# Patient Record
Sex: Male | Born: 1945 | Race: Black or African American | Hispanic: No | Marital: Single | Smoking: Never smoker
Health system: Southern US, Community
[De-identification: ages and names within clinical notes are randomized; demographics above are authoritative.]

## PROBLEM LIST (undated history)

## (undated) DIAGNOSIS — C61 Malignant neoplasm of prostate: Secondary | ICD-10-CM

## (undated) DIAGNOSIS — I10 Essential (primary) hypertension: Secondary | ICD-10-CM

## (undated) DIAGNOSIS — S73014A Posterior dislocation of right hip, initial encounter: Secondary | ICD-10-CM

## (undated) HISTORY — PX: OTHER SURGICAL HISTORY: SHX169

## (undated) HISTORY — PX: PROSTATE BIOPSY: SHX241

## (undated) HISTORY — DX: Malignant neoplasm of prostate: C61

---

## 2004-01-30 HISTORY — PX: OTHER SURGICAL HISTORY: SHX169

## 2004-03-10 ENCOUNTER — Emergency Department: Payer: Self-pay | Admitting: General Practice

## 2004-03-14 ENCOUNTER — Ambulatory Visit: Payer: Self-pay | Admitting: Orthopaedic Surgery

## 2012-06-25 ENCOUNTER — Emergency Department (HOSPITAL_COMMUNITY)
Admission: EM | Admit: 2012-06-25 | Discharge: 2012-06-25 | Disposition: A | Discharge status: Home / Self Care | Payer: Medicare Other | Attending: Emergency Medicine | Admitting: Emergency Medicine

## 2012-06-25 ENCOUNTER — Encounter (HOSPITAL_COMMUNITY): Payer: Self-pay | Admitting: *Deleted

## 2012-06-25 ENCOUNTER — Emergency Department (HOSPITAL_COMMUNITY): Payer: Medicare Other

## 2012-06-25 DIAGNOSIS — I10 Essential (primary) hypertension: Secondary | ICD-10-CM | POA: Insufficient documentation

## 2012-06-25 DIAGNOSIS — Z87828 Personal history of other (healed) physical injury and trauma: Secondary | ICD-10-CM | POA: Diagnosis not present

## 2012-06-25 DIAGNOSIS — R509 Fever, unspecified: Secondary | ICD-10-CM | POA: Diagnosis not present

## 2012-06-25 DIAGNOSIS — N433 Hydrocele, unspecified: Secondary | ICD-10-CM | POA: Diagnosis not present

## 2012-06-25 DIAGNOSIS — N452 Orchitis: Secondary | ICD-10-CM | POA: Diagnosis not present

## 2012-06-25 DIAGNOSIS — R109 Unspecified abdominal pain: Secondary | ICD-10-CM | POA: Diagnosis not present

## 2012-06-25 DIAGNOSIS — N5089 Other specified disorders of the male genital organs: Secondary | ICD-10-CM | POA: Insufficient documentation

## 2012-06-25 DIAGNOSIS — N453 Epididymo-orchitis: Secondary | ICD-10-CM | POA: Diagnosis not present

## 2012-06-25 DIAGNOSIS — N451 Epididymitis: Secondary | ICD-10-CM

## 2012-06-25 DIAGNOSIS — R21 Rash and other nonspecific skin eruption: Secondary | ICD-10-CM | POA: Diagnosis not present

## 2012-06-25 HISTORY — DX: Essential (primary) hypertension: I10

## 2012-06-25 LAB — CBC WITH DIFFERENTIAL/PLATELET
Basophils Relative: 0 % (ref 0–1)
HCT: 39.5 % (ref 39.0–52.0)
Hemoglobin: 13.7 g/dL (ref 13.0–17.0)
Lymphs Abs: 1.9 10*3/uL (ref 0.7–4.0)
MCH: 28.4 pg (ref 26.0–34.0)
MCHC: 34.7 g/dL (ref 30.0–36.0)
Monocytes Absolute: 1.6 10*3/uL — ABNORMAL HIGH (ref 0.1–1.0)
Monocytes Relative: 9 % (ref 3–12)
Neutro Abs: 15.1 10*3/uL — ABNORMAL HIGH (ref 1.7–7.7)
Neutrophils Relative %: 81 % — ABNORMAL HIGH (ref 43–77)
RBC: 4.82 MIL/uL (ref 4.22–5.81)

## 2012-06-25 LAB — COMPREHENSIVE METABOLIC PANEL
Alkaline Phosphatase: 43 U/L (ref 39–117)
BUN: 16 mg/dL (ref 6–23)
Chloride: 97 mEq/L (ref 96–112)
Creatinine, Ser: 1.32 mg/dL (ref 0.50–1.35)
GFR calc Af Amer: 64 mL/min — ABNORMAL LOW (ref >90)
GFR calc non Af Amer: 55 mL/min — ABNORMAL LOW (ref >90)
Glucose, Bld: 126 mg/dL — ABNORMAL HIGH (ref 70–99)
Potassium: 3.1 mEq/L — ABNORMAL LOW (ref 3.5–5.1)
Total Bilirubin: 0.9 mg/dL (ref 0.3–1.2)

## 2012-06-25 LAB — URINALYSIS, ROUTINE W REFLEX MICROSCOPIC
Glucose, UA: NEGATIVE mg/dL
Ketones, ur: 15 mg/dL — AB
Nitrite: NEGATIVE
Urobilinogen, UA: 1 mg/dL (ref 0.0–1.0)

## 2012-06-25 LAB — URINE MICROSCOPIC-ADD ON

## 2012-06-25 MED ORDER — SULFAMETHOXAZOLE-TRIMETHOPRIM 800-160 MG PO TABS: 1.0000 | ORAL_TABLET | Freq: Two times a day (BID) | ORAL | Status: AC

## 2012-06-25 MED ORDER — OXYCODONE-ACETAMINOPHEN 5-325 MG PO TABS
1.0000 | ORAL_TABLET | Freq: Once | ORAL | Status: AC
  Administered 2012-06-25: 1 via ORAL
  Filled 2012-06-25: qty 1

## 2012-06-25 MED ORDER — OXYCODONE-ACETAMINOPHEN 5-325 MG PO TABS: 1.0000 | ORAL_TABLET | ORAL | Status: DC | PRN

## 2012-06-25 MED ORDER — SULFAMETHOXAZOLE-TMP DS 800-160 MG PO TABS
1.0000 | ORAL_TABLET | Freq: Once | ORAL | Status: AC
  Administered 2012-06-25: 1 via ORAL
  Filled 2012-06-25: qty 1

## 2012-06-25 NOTE — ED Notes (Signed)
Pain to lower abdomen with pain and swelling to right testicle.
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
## 2012-06-27 LAB — URINE CULTURE: Colony Count: 15000

## 2012-07-15 DIAGNOSIS — N453 Epididymo-orchitis: Secondary | ICD-10-CM | POA: Diagnosis not present

## 2012-08-12 ENCOUNTER — Other Ambulatory Visit (HOSPITAL_COMMUNITY): Payer: Self-pay | Admitting: Urology

## 2012-08-12 DIAGNOSIS — N451 Epididymitis: Secondary | ICD-10-CM

## 2012-08-12 DIAGNOSIS — N453 Epididymo-orchitis: Secondary | ICD-10-CM | POA: Diagnosis not present

## 2012-08-15 ENCOUNTER — Ambulatory Visit (HOSPITAL_COMMUNITY)
Admission: RE | Admit: 2012-08-15 | Discharge: 2012-08-15 | Disposition: A | Discharge status: Home / Self Care | Payer: Medicare Other | Source: Ambulatory Visit | Attending: Urology | Admitting: Urology

## 2012-08-15 DIAGNOSIS — N451 Epididymitis: Secondary | ICD-10-CM

## 2012-10-21 DIAGNOSIS — H409 Unspecified glaucoma: Secondary | ICD-10-CM | POA: Diagnosis not present

## 2012-10-21 DIAGNOSIS — H4011X0 Primary open-angle glaucoma, stage unspecified: Secondary | ICD-10-CM | POA: Diagnosis not present

## 2013-02-20 DIAGNOSIS — I10 Essential (primary) hypertension: Secondary | ICD-10-CM | POA: Diagnosis not present

## 2013-05-22 DIAGNOSIS — E7800 Pure hypercholesterolemia, unspecified: Secondary | ICD-10-CM | POA: Diagnosis not present

## 2013-06-09 ENCOUNTER — Telehealth: Payer: Self-pay

## 2013-06-09 ENCOUNTER — Other Ambulatory Visit: Payer: Self-pay

## 2013-06-09 DIAGNOSIS — Z1211 Encounter for screening for malignant neoplasm of colon: Secondary | ICD-10-CM

## 2013-06-09 NOTE — Telephone Encounter (Signed)
I returned pt's call and LMOM for a return call.

## 2013-06-09 NOTE — Telephone Encounter (Signed)
Wanting to set up a TCS because of his age.

## 2013-06-11 DIAGNOSIS — E7800 Pure hypercholesterolemia, unspecified: Secondary | ICD-10-CM | POA: Diagnosis not present

## 2013-06-11 DIAGNOSIS — R7301 Impaired fasting glucose: Secondary | ICD-10-CM | POA: Diagnosis not present

## 2013-06-15 NOTE — Telephone Encounter (Signed)
Gastroenterology Pre-Procedure Review  Request Date: 06/09/2013 Requesting Physician: Dr. Legrand Rams  PATIENT REVIEW QUESTIONS: The patient responded to the following health history questions as indicated:    1. Diabetes Melitis: no 2. Joint replacements in the past 12 months: no 3. Major health problems in the past 3 months: no 4. Has an artificial valve or MVP: no 5. Has a defibrillator: no 6. Has been advised in past to take antibiotics in advance of a procedure like teeth cleaning: no    MEDICATIONS & ALLERGIES:    Patient reports the following regarding taking any blood thinners:   Plavix? no Aspirin? no Coumadin? no  Patient confirms/reports the following medications:  Current Outpatient Prescriptions  Medication Sig Dispense Refill  . ibuprofen (ADVIL,MOTRIN) 200 MG tablet Take 600 mg by mouth every 6 (six) hours as needed for pain.      . Multiple Vitamins-Minerals (MULTIVITAMIN GUMMIES ADULT) CHEW Chew 1 each by mouth daily.      Marland Kitchen olmesartan-hydrochlorothiazide (BENICAR HCT) 40-25 MG per tablet Take 1 tablet by mouth every morning.      Marland Kitchen oxyCODONE-acetaminophen (PERCOCET/ROXICET) 5-325 MG per tablet Take 1 tablet by mouth every 4 (four) hours as needed for pain.  20 tablet  0   No current facility-administered medications for this visit.    Patient confirms/reports the following allergies:  No Known Allergies  No orders of the defined types were placed in this encounter.    AUTHORIZATION INFORMATION Primary Insurance:   ID #:  Group #:  Pre-Cert / Auth required: Pre-Cert / Auth #:   Secondary Insurance:   ID #:   Group #:  Pre-Cert / Auth required:  Pre-Cert / Auth #:   SCHEDULE INFORMATION: Procedure has been scheduled as follows:  Date: 07/02/2013                   Time:  12:45 PM Location: [facility] Short Stay   This Gastroenterology Pre-Precedure Review Form is being routed to the following provider(s): R. Garfield Cornea, MD

## 2013-06-16 ENCOUNTER — Encounter (HOSPITAL_COMMUNITY): Payer: Self-pay | Admitting: Pharmacy Technician

## 2013-06-17 MED ORDER — PEG-KCL-NACL-NASULF-NA ASC-C 100 G PO SOLR: 1.0000 | ORAL | Status: DC

## 2013-06-17 NOTE — Telephone Encounter (Signed)
Rx sent to the pharmacy and instructions mailed to pt.  

## 2013-06-17 NOTE — Telephone Encounter (Signed)
Appropriate.

## 2013-06-18 MED ORDER — PEG 3350-KCL-NA BICARB-NACL 420 G PO SOLR: 4000.0000 mL | ORAL | Status: DC

## 2013-06-18 NOTE — Telephone Encounter (Signed)
Movie prep will cost pt $99.00. Pt wants cheaper prep. Will send in [place].

## 2013-06-18 NOTE — Telephone Encounter (Signed)
The trilyte prep will just have a $2.00 copay. I called and LMOM for the pt and I am mailing him the instructions to the Trilyte prep.

## 2013-06-18 NOTE — Addendum Note (Signed)
Addended by: Everardo All on: 06/18/2013 10:28 AM   Modules accepted: Orders

## 2013-07-02 ENCOUNTER — Ambulatory Visit (HOSPITAL_COMMUNITY)
Admission: RE | Admit: 2013-07-02 | Discharge: 2013-07-02 | Disposition: A | Discharge status: Home / Self Care | Payer: Medicare Other | Source: Ambulatory Visit | Attending: Internal Medicine | Admitting: Internal Medicine

## 2013-07-02 ENCOUNTER — Encounter (HOSPITAL_COMMUNITY)
Admission: RE | Disposition: A | Discharge status: Home / Self Care | Payer: Self-pay | Source: Ambulatory Visit | Attending: Internal Medicine

## 2013-07-02 ENCOUNTER — Encounter (HOSPITAL_COMMUNITY): Payer: Self-pay | Admitting: *Deleted

## 2013-07-02 DIAGNOSIS — I10 Essential (primary) hypertension: Secondary | ICD-10-CM | POA: Insufficient documentation

## 2013-07-02 DIAGNOSIS — Z79899 Other long term (current) drug therapy: Secondary | ICD-10-CM | POA: Diagnosis not present

## 2013-07-02 DIAGNOSIS — Z1212 Encounter for screening for malignant neoplasm of rectum: Secondary | ICD-10-CM

## 2013-07-02 DIAGNOSIS — Z1211 Encounter for screening for malignant neoplasm of colon: Secondary | ICD-10-CM | POA: Insufficient documentation

## 2013-07-02 HISTORY — PX: COLONOSCOPY: SHX5424

## 2013-07-02 SURGERY — COLONOSCOPY
Anesthesia: Moderate Sedation

## 2013-07-02 MED ORDER — MIDAZOLAM HCL 5 MG/5ML IJ SOLN
INTRAMUSCULAR | Status: AC
  Filled 2013-07-02: qty 10

## 2013-07-02 MED ORDER — ONDANSETRON HCL 4 MG/2ML IJ SOLN
INTRAMUSCULAR | Status: AC
  Filled 2013-07-02: qty 2

## 2013-07-02 MED ORDER — ONDANSETRON HCL 4 MG/2ML IJ SOLN
INTRAMUSCULAR | Status: DC | PRN
  Administered 2013-07-02: 4 mg via INTRAVENOUS

## 2013-07-02 MED ORDER — SODIUM CHLORIDE 0.9 % IV SOLN
INTRAVENOUS | Status: DC
  Administered 2013-07-02: 11:00:00 via INTRAVENOUS

## 2013-07-02 MED ORDER — MEPERIDINE HCL 100 MG/ML IJ SOLN
INTRAMUSCULAR | Status: AC
  Filled 2013-07-02: qty 2

## 2013-07-02 MED ORDER — MEPERIDINE HCL 100 MG/ML IJ SOLN
INTRAMUSCULAR | Status: DC | PRN
  Administered 2013-07-02: 25 mg via INTRAVENOUS
  Administered 2013-07-02: 50 mg via INTRAVENOUS

## 2013-07-02 MED ORDER — STERILE WATER FOR IRRIGATION IR SOLN
Status: DC | PRN
  Administered 2013-07-02: 11:00:00

## 2013-07-02 MED ORDER — MIDAZOLAM HCL 5 MG/5ML IJ SOLN
INTRAMUSCULAR | Status: DC | PRN
  Administered 2013-07-02: 1 mg via INTRAVENOUS
  Administered 2013-07-02 (×2): 2 mg via INTRAVENOUS

## 2013-07-02 NOTE — Op Note (Signed)
[facility] [address] [place], [address]   COLONOSCOPY PROCEDURE REPORT  PATIENT: Darrell Harrison, Darrell Harrison  MR#:         [id number] BIRTHDATE: [birth year] , 66  yrs. old GENDER: Male ENDOSCOPIST: R.  Garfield Cornea, MD FACP FACG REFERRED BY:  Conni Slipper, M.D. PROCEDURE DATE:  07/02/2013 PROCEDURE:    Ileocolonoscopy-screening  INDICATIONS: First ever average risk colorectal cancer screening examination  INFORMED CONSENT:  The risks, benefits, alternatives and imponderables including but not limited to bleeding, perforation as well as the possibility of a missed lesion have been reviewed.  The potential for biopsy, lesion removal, etc. have also been discussed.  Questions have been answered.  All parties agreeable. Please see the history and physical in the medical record for more information.  MEDICATIONS: Versed 5 mg IV and Demerol 75 mg IV in divided doses. Zofran 4 mg IV.  DESCRIPTION OF PROCEDURE:  After a digital rectal exam was performed, the EC-3890Li ([id number])  colonoscope was advanced from the anus through the rectum and colon to the area of the cecum, ileocecal valve and appendiceal orifice.  The cecum was deeply intubated.  These structures were well-seen and photographed for the record.  From the level of the cecum and ileocecal valve, the scope was slowly and cautiously withdrawn.  The mucosal surfaces were carefully surveyed utilizing scope tip deflection to facilitate fold flattening as needed.  The scope was pulled down into the rectum where a thorough examination including retroflexion was performed.    FINDINGS:  Adequate preparation. Normal rectum. Normal appearing colonic mucosa. Normal-appearing distal 5 cm of terminal ileal mucosa.  THERAPEUTIC / DIAGNOSTIC MANEUVERS PERFORMED:  None  COMPLICATIONS: None  CECAL WITHDRAWAL TIME:  8 minutes  IMPRESSION:  Normal ileocolonoscopy  RECOMMENDATIONS: Repeat colonoscopy in 10 years for  screening purposes.   _______________________________ eSigned:  R. Garfield Cornea, MD FACP [facility] 07/02/2013 11:55 AM   CC:

## 2013-07-02 NOTE — Discharge Instructions (Addendum)
°  Colonoscopy Discharge Instructions  Read the instructions outlined below and refer to this sheet in the next few weeks. These discharge instructions provide you with general information on caring for yourself after you leave the hospital. Your doctor may also give you specific instructions. While your treatment has been planned according to the most current medical practices available, unavoidable complications occasionally occur. If you have any problems or questions after discharge, call Dr. Gala Romney at [phone]. ACTIVITY  You may resume your regular activity, but move at a slower pace for the next 24 hours.   Take frequent rest periods for the next 24 hours.   Walking will help get rid of the air and reduce the bloated feeling in your belly (abdomen).   No driving for 24 hours (because of the medicine (anesthesia) used during the test).    Do not sign any important legal documents or operate any machinery for 24 hours (because of the anesthesia used during the test).  NUTRITION  Drink plenty of fluids.   You may resume your normal diet as instructed by your doctor.   Begin with a light meal and progress to your normal diet. Heavy or fried foods are harder to digest and may make you feel sick to your stomach (nauseated).   Avoid alcoholic beverages for 24 hours or as instructed.  MEDICATIONS  You may resume your normal medications unless your doctor tells you otherwise.  WHAT YOU CAN EXPECT TODAY  Some feelings of bloating in the abdomen.   Passage of more gas than usual.   Spotting of blood in your stool or on the toilet paper.  IF YOU HAD POLYPS REMOVED DURING THE COLONOSCOPY:  No aspirin products for 7 days or as instructed.   No alcohol for 7 days or as instructed.   Eat a soft diet for the next 24 hours.  FINDING OUT THE RESULTS OF YOUR TEST Not all test results are available during your visit. If your test results are not back during the visit, make an appointment  with your caregiver to find out the results. Do not assume everything is normal if you have not heard from your caregiver or the medical facility. It is important for you to follow up on all of your test results.  SEEK IMMEDIATE MEDICAL ATTENTION IF:  You have more than a spotting of blood in your stool.   Your belly is swollen (abdominal distention).   You are nauseated or vomiting.   You have a temperature over 101.   You have abdominal pain or discomfort that is severe or gets worse throughout the day.     Your colonoscopy today was normal  I recommend you have another colonoscopy for screening purposes in 10 years
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
## 2013-07-03 ENCOUNTER — Encounter (HOSPITAL_COMMUNITY): Payer: Self-pay | Admitting: Internal Medicine

## 2013-08-21 DIAGNOSIS — E7800 Pure hypercholesterolemia, unspecified: Secondary | ICD-10-CM | POA: Diagnosis not present

## 2013-08-21 DIAGNOSIS — I10 Essential (primary) hypertension: Secondary | ICD-10-CM | POA: Diagnosis not present

## 2014-03-02 DIAGNOSIS — E785 Hyperlipidemia, unspecified: Secondary | ICD-10-CM | POA: Diagnosis not present

## 2014-03-02 DIAGNOSIS — Z0000 Encounter for general adult medical examination without abnormal findings: Secondary | ICD-10-CM | POA: Diagnosis not present

## 2014-03-02 DIAGNOSIS — Z0001 Encounter for general adult medical examination with abnormal findings: Secondary | ICD-10-CM | POA: Diagnosis not present

## 2014-03-02 DIAGNOSIS — R739 Hyperglycemia, unspecified: Secondary | ICD-10-CM | POA: Diagnosis not present

## 2014-03-02 DIAGNOSIS — C61 Malignant neoplasm of prostate: Secondary | ICD-10-CM | POA: Diagnosis not present

## 2014-03-02 DIAGNOSIS — I10 Essential (primary) hypertension: Secondary | ICD-10-CM | POA: Diagnosis not present

## 2014-03-02 DIAGNOSIS — E663 Overweight: Secondary | ICD-10-CM | POA: Diagnosis not present

## 2014-03-05 DIAGNOSIS — E785 Hyperlipidemia, unspecified: Secondary | ICD-10-CM | POA: Diagnosis not present

## 2014-03-05 DIAGNOSIS — E663 Overweight: Secondary | ICD-10-CM | POA: Diagnosis not present

## 2014-03-05 DIAGNOSIS — R972 Elevated prostate specific antigen [PSA]: Secondary | ICD-10-CM | POA: Diagnosis not present

## 2014-03-05 DIAGNOSIS — I10 Essential (primary) hypertension: Secondary | ICD-10-CM | POA: Diagnosis not present

## 2014-04-06 DIAGNOSIS — R972 Elevated prostate specific antigen [PSA]: Secondary | ICD-10-CM | POA: Diagnosis not present

## 2014-04-06 DIAGNOSIS — N401 Enlarged prostate with lower urinary tract symptoms: Secondary | ICD-10-CM | POA: Diagnosis not present

## 2014-04-06 DIAGNOSIS — R351 Nocturia: Secondary | ICD-10-CM | POA: Diagnosis not present

## 2014-05-17 DIAGNOSIS — C61 Malignant neoplasm of prostate: Secondary | ICD-10-CM | POA: Diagnosis not present

## 2014-05-17 DIAGNOSIS — R972 Elevated prostate specific antigen [PSA]: Secondary | ICD-10-CM | POA: Diagnosis not present

## 2014-05-24 ENCOUNTER — Other Ambulatory Visit (HOSPITAL_COMMUNITY): Payer: Self-pay | Admitting: Urology

## 2014-05-24 DIAGNOSIS — C61 Malignant neoplasm of prostate: Secondary | ICD-10-CM

## 2014-05-31 ENCOUNTER — Other Ambulatory Visit (HOSPITAL_COMMUNITY): Payer: Self-pay | Admitting: Urology

## 2014-05-31 DIAGNOSIS — C61 Malignant neoplasm of prostate: Secondary | ICD-10-CM

## 2014-06-01 DIAGNOSIS — N182 Chronic kidney disease, stage 2 (mild): Secondary | ICD-10-CM | POA: Diagnosis not present

## 2014-06-01 DIAGNOSIS — E785 Hyperlipidemia, unspecified: Secondary | ICD-10-CM | POA: Diagnosis not present

## 2014-06-01 DIAGNOSIS — I10 Essential (primary) hypertension: Secondary | ICD-10-CM | POA: Diagnosis not present

## 2014-06-01 DIAGNOSIS — E663 Overweight: Secondary | ICD-10-CM | POA: Diagnosis not present

## 2014-06-10 ENCOUNTER — Ambulatory Visit (HOSPITAL_COMMUNITY): Payer: Medicare Other

## 2014-06-10 ENCOUNTER — Encounter (HOSPITAL_COMMUNITY): Payer: Medicare Other

## 2014-06-18 ENCOUNTER — Encounter (HOSPITAL_COMMUNITY)
Admission: RE | Admit: 2014-06-18 | Discharge: 2014-06-18 | Disposition: A | Discharge status: Home / Self Care | Payer: Medicare Other | Source: Ambulatory Visit | Attending: Urology | Admitting: Urology

## 2014-06-18 DIAGNOSIS — C61 Malignant neoplasm of prostate: Secondary | ICD-10-CM | POA: Insufficient documentation

## 2014-06-18 MED ORDER — TECHNETIUM TC 99M MEDRONATE IV KIT
25.0000 | PACK | Freq: Once | INTRAVENOUS | Status: AC | PRN
  Administered 2014-06-18: 25 via INTRAVENOUS

## 2014-07-02 ENCOUNTER — Ambulatory Visit (HOSPITAL_COMMUNITY)
Admission: RE | Admit: 2014-07-02 | Discharge: 2014-07-02 | Disposition: A | Discharge status: Home / Self Care | Payer: Medicare Other | Source: Ambulatory Visit | Attending: Urology | Admitting: Urology

## 2014-07-02 DIAGNOSIS — C61 Malignant neoplasm of prostate: Secondary | ICD-10-CM

## 2014-07-15 ENCOUNTER — Ambulatory Visit (HOSPITAL_COMMUNITY): Payer: Medicare Other

## 2014-08-13 ENCOUNTER — Ambulatory Visit (HOSPITAL_COMMUNITY)
Admission: RE | Admit: 2014-08-13 | Discharge: 2014-08-13 | Disposition: A | Discharge status: Home / Self Care | Payer: Medicare Other | Source: Ambulatory Visit | Attending: Urology | Admitting: Urology

## 2014-08-13 DIAGNOSIS — N402 Nodular prostate without lower urinary tract symptoms: Secondary | ICD-10-CM | POA: Diagnosis not present

## 2014-08-13 DIAGNOSIS — R972 Elevated prostate specific antigen [PSA]: Secondary | ICD-10-CM | POA: Diagnosis present

## 2014-08-13 DIAGNOSIS — N400 Enlarged prostate without lower urinary tract symptoms: Secondary | ICD-10-CM | POA: Diagnosis not present

## 2014-08-13 LAB — POCT I-STAT CREATININE: Creatinine, Ser: 1.5 mg/dL — ABNORMAL HIGH (ref 0.61–1.24)

## 2014-08-13 MED ORDER — GADOBENATE DIMEGLUMINE 529 MG/ML IV SOLN
20.0000 mL | Freq: Once | INTRAVENOUS | Status: AC | PRN
Start: 2014-08-13 — End: 2014-08-13
  Administered 2014-08-13: 17 mL via INTRAVENOUS

## 2014-08-17 ENCOUNTER — Telehealth: Payer: Self-pay | Admitting: Medical Oncology

## 2014-08-17 NOTE — Telephone Encounter (Signed)
Oncology Nurse Navigator Documentation  Oncology Nurse Navigator Flowsheets 08/17/2014  Referral date to RadOnc/MedOnc 08/17/2014  Navigator Encounter Type Introductory phone call-I called pt to introduce myself as the Prostate Nurse Navigator and the Coordinator of the Prostate [place].  1. I confirmed with the patient he is aware of his referral to the clinic. He states that his daughter provides transportation for him and she will be on vacation next week so he will not be able to attend the 7/26 clinic. I explained we can reschedule him to one of the clinics in August. He asked that I speak with his daughter with new date and time.   2. I discussed the format of the clinic and the physicians he will be seeing that day.  3. I discussed where the clinic is located and how to contact me.  4. I confirmed his address and informed him I would be mailing a packet of information and forms to be completed. I asked him to bring them with him the day of his appointment.   He voiced understanding of the above. I asked him to call me if he has any questions or concerns regarding his appointments or the forms he needs to complete.  Time Spent with Patient 15

## 2014-08-24 ENCOUNTER — Ambulatory Visit: Payer: Medicare Other | Admitting: Radiation Oncology

## 2014-08-31 DIAGNOSIS — E785 Hyperlipidemia, unspecified: Secondary | ICD-10-CM | POA: Diagnosis not present

## 2014-08-31 DIAGNOSIS — I10 Essential (primary) hypertension: Secondary | ICD-10-CM | POA: Diagnosis not present

## 2014-08-31 DIAGNOSIS — E663 Overweight: Secondary | ICD-10-CM | POA: Diagnosis not present

## 2014-09-07 ENCOUNTER — Encounter: Payer: Self-pay | Admitting: Radiation Oncology

## 2014-09-07 NOTE — Progress Notes (Signed)
GU Location of Tumor / Histology: prostatic adenocarcinoma   If Prostate Cancer, Gleason Score is (3 + 3) and PSA is (23.75)  Darrell Harrison . was referred by his PCP, Dr. Legrand Rams, to Dr. Junious Silk for elevated PSA of 18.8 on February 2016.  Biopsies of prostate (if applicable) revealed:    Past/Anticipated interventions by urology, if any: ultrasound, biopsy and referral to [facility]  Past/Anticipated interventions by medical oncology, if any: referral to [facility]  Weight changes, if any: no  Bowel/Bladder complaints, if any: occasional weak stream, nocturia x 3 and ED   Nausea/Vomiting, if any: no  Pain issues, if any:  no  SAFETY ISSUES:  Prior radiation? no  Pacemaker/ICD? no  Possible current pregnancy? no  Is the patient on methotrexate? no  Current Complaints / other details:  69 year old male. Single. Four children. Prostate volume 47.6 cc.

## 2014-09-09 ENCOUNTER — Telehealth: Payer: Self-pay | Admitting: Medical Oncology

## 2014-09-09 NOTE — Telephone Encounter (Signed)
Oncology Nurse Navigator Documentation  Oncology Nurse Navigator Flowsheets 08/17/2014 09/09/2014  Referral date to RadOnc/MedOnc 08/17/2014 -Left a message requesting a return call to confirm his appointment 8/12 arriving at 7:30am.  Navigator Encounter Type Introductory phone call Telephone  Time Spent with Patient 15 15

## 2014-09-10 ENCOUNTER — Encounter: Payer: Self-pay | Admitting: Medical Oncology

## 2014-09-10 ENCOUNTER — Ambulatory Visit: Payer: Medicare Other

## 2014-09-10 ENCOUNTER — Ambulatory Visit
Admission: RE | Admit: 2014-09-10 | Discharge: 2014-09-10 | Disposition: A | Discharge status: Home / Self Care | Payer: Medicare Other | Source: Ambulatory Visit | Attending: Radiation Oncology | Admitting: Radiation Oncology

## 2014-09-10 ENCOUNTER — Encounter: Payer: Self-pay | Admitting: General Practice

## 2014-09-10 ENCOUNTER — Encounter: Payer: Self-pay | Admitting: Radiation Oncology

## 2014-09-10 ENCOUNTER — Ambulatory Visit (HOSPITAL_BASED_OUTPATIENT_CLINIC_OR_DEPARTMENT_OTHER): Payer: Medicare Other | Admitting: Oncology

## 2014-09-10 VITALS — BP 188/105 | HR 66 | Resp 16 | Ht 69.0 in | Wt 197.0 lb

## 2014-09-10 DIAGNOSIS — C61 Malignant neoplasm of prostate: Secondary | ICD-10-CM | POA: Insufficient documentation

## 2014-09-10 NOTE — Progress Notes (Signed)
                               Care Plan Summary  Name: Darrell Harrison, Study DOB: 01/06/1946   Your Medical Team:   Urologist -  Dr. Raynelle Bring, [facility]  Radiation Oncologist - Dr. Tyler Pita, [facility]   Medical Oncologist - Dr. Zola Button, [facility]  Recommendations: 1) Recheck PSA 2) Surgery 3) Androgen Deprivation with Radiation  * These recommendations are based on information available as of today's consult.      Recommendations may change depending on the results of further tests or exams.    Next Steps: 1) PSA drawn today 2) Call you with results  3) Scheduled for gold markers Dr. Junious Silk 4) Schedule radiation in [place] ( Dr. Tammi Klippel will schedule)  When appointments need to be scheduled, you will be contacted by [facility] and/or [facility].  Questions?  Please do not hesitate to call Cira Rue, RN, BSN, CRNI at [phone] any questions or concerns.  Shirlean Mylar is your Oncology Nurse Navigator and is available to assist you while you're receiving your medical care at [facility].
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
## 2014-09-10 NOTE — Consult Note (Signed)
Chief Complaint  Prostate Cancer   Reason For Visit  Reason for consult: To discuss treatment options for prostate cancer and specifically to consider surgical treatment with robotic prostatectomy. Physician requesting consult: Dr. Eda Keys PCP: Dr. Legrand Rams Location of consult: [facility]   History of Present Illness   Darrell Harrison is a 69 year old healthy gentleman who was noted to have an elevated PSA of 18.8 prompting urologic referral.  His PSA was repeated and found to be 23.75 despite a normal DRE. He underwent a prostate needle biopsy on 05/17/14 demonstrating Gleason 3+3=6 adenocarcinoma of the prostate in 5% of 1 out of 12 biopsy cores.  Prostate volume was 47.6 cc.  He underwent staging studies including a bone scan (06/18/14) which was unremarkable and a CT scan of the pelvis (06/18/14) which was also unremarkable without evidence of metastatic disease. An MRI of the prostate was then performed on 08/13/14 that demonstrated a 17 mm hypointense area of the prostatic apex with restricted diffusion in the central gland mostly on the right that is not specific for malignancy.  He has no family history of prostate cancer.  He is seen with his daughter today.  TNM stage: cT1c Nx Mx PSA: 23.75 Gleason score: 3+3=6 Biopsy (05/17/14): 1/12 cores -- L apex (5%) Prostate volume: 47.6 cc PSAD: 0.50  Nomogram OC disease: 41% EPE: 55% SVI: 2% LNI: 2% PFS (surgery): 83% at 5 years, 72% at 10 years  Urinary function:  IPSS is 7.  Erectile function: SHIM score is 15.  He estimates that he could get an adequate erection maybe 3 out of 10 times.  Erectile function is not an important priority for him.   Past Medical History  1. History of arthritis (Z87.39)  2. History of hypercholesterolemia (Z86.39)  3. History of hypertension (Z86.79)  Surgical History  1. History of No Surgical Problems  Current Meds  1. Aspirin 81 MG TABS;   Therapy: (Recorded:08Mar2016) to Recorded  2. Atorvastatin Calcium TABS;  Therapy: (Recorded:08Mar2016) to Recorded  3. Losartan Potassium-HCTZ 100-25 MG Oral Tablet;  Therapy: (Recorded:08Mar2016) to Recorded  Allergies  1. No Known Drug Allergies  Family History  1. Family history of myocardial infarction (Z82.49) : Father  2. Family history of stroke (Z82.3) : Mother  Social History   Four children   Never a smoker   Occasional alcohol use   Single  Review of Systems AU Complete-Male: Genitourinary, constitutional, skin, eye, otolaryngeal, hematologic/lymphatic, cardiovascular, pulmonary, endocrine, musculoskeletal, gastrointestinal, neurological and psychiatric system(s) were reviewed and pertinent findings if present are noted and are otherwise negative.    Physical Exam Constitutional: Well nourished and well developed . No acute distress.  ENT:. The ears and nose are normal in appearance.  Neck: The appearance of the neck is normal and no neck mass is present.  Pulmonary: No respiratory distress, normal respiratory rhythm and effort and clear bilateral breath sounds.  Cardiovascular: Heart rate and rhythm are normal . No peripheral edema.  Abdomen: The abdomen is rounded. The abdomen is soft and nontender. No masses are palpated. No CVA tenderness. No hernias are palpable. No hepatosplenomegaly noted.  Rectal: Rectal exam demonstrates normal sphincter tone, no tenderness and no masses. Prostate size is estimated to be 55 g. The prostate has no nodularity and is not tender. The left seminal vesicle is nonpalpable. The right seminal vesicle is nonpalpable. The perineum is normal on inspection.  Lymphatics: The femoral and inguinal nodes are  not enlarged or tender.  Skin: Normal skin turgor, no visible rash and no visible skin lesions.  Neuro/Psych:. Mood and affect are appropriate.    Results/Data  I have independently reviewed his medical records, PSA results, imaging  studies, and pathology slides as described above.     Assessment  1. Prostate cancer (C61)  Discussion/Summary  1. Prostate cancer: I had a long discussion with Mr. Koval and his daughter today. We specifically discussed the discordance between his PSA values and his pathology findings.  He technically has high risk prostate cancer based on his PSA and there is no other explanation for his PSA elevation.  He understands that standard recommendations would include primary surgical therapy or radiation therapy/long term androgen deprivation for 2-3 years. We reviewed the pros and cons of each of those options today.  I also recommended that he proceed with a repeat PSA today to confirm the high risk nature of his disease. He understands that if his PSA has significantly decreased that he may be reclassified with low risk disease which would change his treatment options.   The patient was counseled about the natural history of prostate cancer and the standard treatment options that are available for prostate cancer. It was explained to him how his age and life expectancy, clinical stage, Gleason score, and PSA affect his prognosis, the decision to proceed with additional staging studies, as well as how that information influences recommended treatment strategies. We discussed the roles for active surveillance, radiation therapy, surgical therapy, androgen deprivation, as well as ablative therapy options for the treatment of prostate cancer as appropriate to his individual cancer situation. We discussed the risks and benefits of these options with regard to their impact on cancer control and also in terms of potential adverse events, complications, and impact on quiality of life particularly related to urinary, bowel, and sexual function. The patient was encouraged to ask questions throughout the discussion today and all questions were answered to his stated satisfaction. In addition, the patient was provided  with and/or directed to appropriate resources and literature for further education about prostate cancer and treatment options.   We discussed surgical therapy for prostate cancer including the different available surgical approaches. We discussed, in detail, the risks and expectations of surgery with regard to cancer control, urinary control, and erectile function as well as the expected postoperative recovery process. Additional risks of surgery including but not limited to bleeding, infection, hernia formation, nerve damage, lymphocele formation, bowel/rectal injury potentially necessitating colostomy, damage to the urinary tract resulting in urine leakage, urethral stricture, and the cardiopulmonary risks such as myocardial infarction, stroke, death, venothromboembolism, etc. were explained. The risk of open surgical conversion for robotic/laparoscopic prostatectomy was also discussed.   He will meet with Dr. Alen Blew and Dr. Tammi Klippel today and will consider his options.  If he proceeded with surgical treatment, I have recommended a BNS RAL radical prostatectomy with BPLND.  A total of 55 minutes were spent in the overall care of the patient today with 45 minutes in direct face to face consultation.   cc: Dr. Tyler Pita Dr. Zola Button Dr. Eda Keys Dr. Hale Drone Electronically signed by : Raynelle Bring, M.D.; Sep 10 2014 12:19PM EST

## 2014-09-10 NOTE — Progress Notes (Signed)
[facility] Psychosocial Distress Screening Spiritual Care  Met with Darrell Harrison and his daughter at [facility] to introduce [facility] team/resources, reviewing distress screen per protocol.  The patient scored a 7 on the Psychosocial Distress Thermometer which indicates severe distress. Also assessed for distress and other psychosocial needs.   ONCBCN DISTRESS SCREENING 09/10/2014  Screening Type Initial Screening  Distress experienced in past week (1-10) 7  Practical problem type Insurance  Emotional problem type Nervousness/Anxiety;Adjusting to illness  Information Concerns Type Lack of info about diagnosis  Referral to support programs Yes  Other Spiritual Care   Per pt, he is still nervous about his health situation, but less distressed now that he has more info from clinic; he is very appreciative of in-person team approach and clear communication.  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    Follow up needed: Yes.  Pt may benefit from a f/u call from LCSW to field questions/offer support.  Pt/dtr aware of ongoing chaplain/support team availability and have print materials with contact info.  Please also page as needs arise.  Thank you.  [place], [place] Pager [phone] Voicemail  [phone]

## 2014-09-10 NOTE — Progress Notes (Signed)
Divorced. Works as an Metallurgist. One daughter, Seaborn Nakama who resides in [place].

## 2014-09-10 NOTE — Progress Notes (Signed)
Please see consult note.  
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
## 2014-09-11 DIAGNOSIS — C61 Malignant neoplasm of prostate: Secondary | ICD-10-CM | POA: Insufficient documentation

## 2014-09-13 ENCOUNTER — Telehealth: Payer: Self-pay | Admitting: Medical Oncology

## 2014-09-13 LAB — PSA: PSA: 27.72 ng/mL — ABNORMAL HIGH (ref <=4.00)

## 2014-09-13 NOTE — Telephone Encounter (Signed)
Oncology Nurse Navigator Documentation  Oncology Nurse Navigator Flowsheets 09/09/2014 09/10/2014 09/13/2014  Referral date to RadOnc/MedOnc - - -  Navigator Encounter Type Telephone Clinic/MDC Telephone- Patient and his daughter called asking if the PSA results are back. I gave them the results. Pt knows that he will now need radiation. He asked if there was a facility in [place] where  he could get the radiation treatments. He works in [employer] and this would allow him to work. I informed them he can get his radiation at [facility]. I forwarded a message to Dr. Tammi Klippel and Dr. Alinda Money with the above information.  Barriers/Navigation Needs - - No barriers at this time  Interventions - - Coordination of Care  Coordination of Care - - Other  Support Groups/Services - Friends and Family Friends and Family  Time Spent with Patient 10 17 51

## 2014-09-14 ENCOUNTER — Telehealth: Payer: Self-pay | Admitting: Medical Oncology

## 2014-09-14 NOTE — Telephone Encounter (Signed)
Oncology Nurse Navigator Documentation  Oncology Nurse Navigator Flowsheets 09/10/2014 09/13/2014 09/14/2014  Referral date to RadOnc/MedOnc - - -  Navigator Encounter Type Clinic/MDC Telephone Telephone- Darrell Harrison, daughter called stating her father received a  call from [facility] with an appointment 8/18. She asked if this will be the appointment where he has the markers placed. I explained this is a consult with Dr. Donella Stade to discuss radiation and treatment planning. Dr. Junious Silk will place the gold markers. She voiced understanding and thanked me for clarification.  Barriers/Navigation Needs - No barriers at this time -  Interventions - Coordination of Care -  Coordination of Care - Other Other  Support Groups/Services Friends and Family Friends and Family -  Time Spent with Patient [phone]

## 2014-09-16 ENCOUNTER — Ambulatory Visit
Admission: RE | Admit: 2014-09-16 | Discharge: 2014-09-16 | Disposition: A | Discharge status: Home / Self Care | Payer: Medicare Other | Source: Ambulatory Visit | Attending: Radiation Oncology | Admitting: Radiation Oncology

## 2014-09-16 ENCOUNTER — Encounter: Payer: Self-pay | Admitting: Radiation Oncology

## 2014-09-16 VITALS — BP 188/117 | HR 71 | Temp 96.8°F | Resp 20 | Ht 68.0 in | Wt 195.7 lb

## 2014-09-16 DIAGNOSIS — Z510 Encounter for antineoplastic radiation therapy: Secondary | ICD-10-CM | POA: Insufficient documentation

## 2014-09-16 DIAGNOSIS — C61 Malignant neoplasm of prostate: Secondary | ICD-10-CM | POA: Diagnosis not present

## 2014-09-16 DIAGNOSIS — R351 Nocturia: Secondary | ICD-10-CM | POA: Insufficient documentation
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
## 2014-09-20 ENCOUNTER — Telehealth: Payer: Self-pay | Admitting: Medical Oncology

## 2014-09-20 NOTE — Telephone Encounter (Signed)
Oncology Nurse Navigator Documentation  Oncology Nurse Navigator Flowsheets 09/13/2014 09/14/2014 09/20/2014  Referral date to RadOnc/MedOnc - - -  Navigator Encounter Type Telephone Telephone Telephone-LaTonya-daughter called stating that Dr. Lyndal Rainbow office scheduled the appointment for placing the gold markers 9/21 He has a CT sim appointment at [place]. She states her dad is confused and does not know what to do. I will call [place] and get his CT sim rescheduled. She states he has not gotten his shot to block the hormones yet. I will check on this also.  Barriers/Navigation Needs No barriers at this time - -  Interventions Coordination of Care - Coordination of Care  Coordination of Care Other Other MD Appointments  Support Groups/Services Friends and Family - Friends and Family  Time Spent with Patient 15 15 15

## 2014-09-24 ENCOUNTER — Telehealth: Payer: Self-pay | Admitting: Medical Oncology

## 2014-09-24 NOTE — Telephone Encounter (Signed)
Oncology Nurse Navigator Documentation  Oncology Nurse Navigator Flowsheets 09/23/2014 09/24/2014 09/24/2014  Referral date to RadOnc/MedOnc - - -  Navigator Encounter Type Telephone Telephone Telephone-I left patient a note that [facility] states that if there is a cancellation she will move pt up to get his markers, but for now it will be 9/21. I called [place] and they moved the CT sim to 9/26 at 10 am. I asked him to call me to confirm this date.  Barriers/Navigation Needs - - -  Interventions Coordination of Care Coordination of Care Coordination of Care  Coordination of Care MD Appointments MD Appointments MD Appointments  Support Groups/Services - - -  Time Spent with Patient 15 15 15

## 2014-09-24 NOTE — Telephone Encounter (Signed)
Oncology Nurse Navigator Documentation  Oncology Nurse Navigator Flowsheets 09/23/2014 09/23/2014 09/24/2014  Referral date to RadOnc/MedOnc - - -  Navigator Encounter Type Telephone Telephone Telephone- I left [place] a message to inform her that [facility] states that if there is a cancellation she will move pt up to get his markers, but for now it will be 9/21. I called [place] and they moved the CT sim to 9/26 at 10 am. I asked Virgilio Belling to call me back to confirm this date and time.  Barriers/Navigation Needs - - -  Interventions Coordination of Care Coordination of Care Coordination of Care  Coordination of Care MD Appointments MD Appointments MD Appointments  Support Groups/Services - - -  Time Spent with Patient 15 15 -

## 2014-09-24 NOTE — Telephone Encounter (Signed)
Oncology Nurse Navigator Documentation  Oncology Nurse Navigator Flowsheets 09/20/2014 09/23/2014 09/23/2014  Referral date to RadOnc/MedOnc - - -  Navigator Encounter Type Telephone Telephone Telephone-I spoke with Danae Chen- Dr. Lyndal Rainbow nurse and she states that unless they have a cancellation they will not be able to move Mr. Decesare gold marker appt up. I will contact radiation at [facility] regarding this matter and I will notify Latonya-daughter.  Barriers/Navigation Needs - - -  Interventions Coordination of Care Coordination of Care Coordination of Care  Coordination of Care MD Appointments MD Appointments MD Appointments  Support Groups/Services Friends and Family - -  Time Spent with Patient [phone]

## 2014-09-24 NOTE — Telephone Encounter (Signed)
Oncology Nurse Navigator Documentation  Oncology Nurse Navigator Flowsheets 09/14/2014 09/20/2014 09/23/2014  Referral date to RadOnc/MedOnc - - -  Navigator Encounter Type Telephone Telephone Telephone- I called and left [facility]a- Dr. Lyndal Rainbow nurse a message regarding the date for gold marker placement. Virgilio Belling- daughter of Mr. Ferrell called stating that his CT sim is scheduled for 9/06 but his gold markers will not be placed until 9/21. I asked Danae Chen to call me to see if we can move this appointment up.  Barriers/Navigation Needs - - -  Interventions - Coordination of Care Coordination of Care  Coordination of Care Other MD Appointments MD [place] and Family -  Time Spent with Patient 15 15 15

## 2014-10-05 ENCOUNTER — Ambulatory Visit: Payer: Medicare Other

## 2014-10-20 DIAGNOSIS — C61 Malignant neoplasm of prostate: Secondary | ICD-10-CM | POA: Diagnosis not present

## 2014-10-21 ENCOUNTER — Telehealth: Payer: Self-pay | Admitting: Medical Oncology

## 2014-10-21 NOTE — Telephone Encounter (Signed)
Oncology Nurse Navigator Documentation  Oncology Nurse Navigator Flowsheets 09/24/2014 10/21/2014 10/21/2014  Referral date to RadOnc/MedOnc - - -  Navigator Encounter Type Telephone Telephone Telephone-I called Darrell Harrison to inform him of his appointment for CT simulation Monday 9/26 at 10:00 at [facility]. He states he is doing well except he is sore from getting his markers placed. He voiced understanding of his appointment. I asked him to call me with any questions or concerns. He voiced understanding.  Treatment Phase - Other CT SIM;Treatment  Barriers/Navigation Needs - - -  Interventions Coordination of Care Coordination of Care Coordination of Care  Coordination of Care MD Appointments MD Appointments MD [place] and Family Friends and Family  Time Spent with Patient 15 15 30

## 2014-10-21 NOTE — Telephone Encounter (Signed)
Oncology Nurse Navigator Documentation  Oncology Nurse Navigator Flowsheets 09/24/2014 09/24/2014 10/21/2014  Referral date to RadOnc/MedOnc - - -  Navigator Encounter Type Telephone Telephone Telephone-Daughter Virgilio Belling called stating that her father got his gold markers placed yesterday. She is not sure how they go about getting him an appointment at [facility] for his radiation treatments. I explained to her that he has an appointment 9/26 at 10 am for his CT simulation. I explained to her what this appointment entails. She asked me if I would call her father and give him this same information. I will be happy to call him. I asked her to call me with any questions or concerns. She voiced understanding.  Treatment Phase - - Other  Barriers/Navigation Needs - - -  Interventions Coordination of Care Coordination of Care -  Coordination of Care MD Appointments MD Appointments -  Support Groups/Services - - -  Time Spent with Patient 15 15 15

## 2014-10-25 ENCOUNTER — Ambulatory Visit
Admission: RE | Admit: 2014-10-25 | Discharge: 2014-10-25 | Disposition: A | Discharge status: Home / Self Care | Payer: Medicare Other | Source: Ambulatory Visit | Attending: Radiation Oncology | Admitting: Radiation Oncology

## 2014-10-25 DIAGNOSIS — C61 Malignant neoplasm of prostate: Secondary | ICD-10-CM | POA: Diagnosis not present

## 2014-10-25 DIAGNOSIS — Z510 Encounter for antineoplastic radiation therapy: Secondary | ICD-10-CM | POA: Diagnosis not present

## 2014-10-25 DIAGNOSIS — R351 Nocturia: Secondary | ICD-10-CM | POA: Diagnosis not present

## 2014-11-01 ENCOUNTER — Other Ambulatory Visit: Payer: Self-pay | Admitting: *Deleted

## 2014-11-01 DIAGNOSIS — C61 Malignant neoplasm of prostate: Secondary | ICD-10-CM | POA: Diagnosis not present

## 2014-11-01 DIAGNOSIS — Z510 Encounter for antineoplastic radiation therapy: Secondary | ICD-10-CM | POA: Diagnosis not present

## 2014-11-01 DIAGNOSIS — R351 Nocturia: Secondary | ICD-10-CM | POA: Diagnosis not present

## 2014-11-02 ENCOUNTER — Ambulatory Visit
Admission: RE | Admit: 2014-11-02 | Discharge: 2014-11-02 | Disposition: A | Discharge status: Home / Self Care | Payer: Medicare Other | Source: Ambulatory Visit | Attending: Radiation Oncology | Admitting: Radiation Oncology

## 2014-11-02 DIAGNOSIS — R351 Nocturia: Secondary | ICD-10-CM | POA: Diagnosis not present

## 2014-11-02 DIAGNOSIS — Z510 Encounter for antineoplastic radiation therapy: Secondary | ICD-10-CM | POA: Diagnosis not present

## 2014-11-02 DIAGNOSIS — C61 Malignant neoplasm of prostate: Secondary | ICD-10-CM | POA: Diagnosis not present

## 2014-11-03 ENCOUNTER — Ambulatory Visit
Admission: RE | Admit: 2014-11-03 | Discharge: 2014-11-03 | Disposition: A | Discharge status: Home / Self Care | Payer: Medicare Other | Source: Ambulatory Visit | Attending: Radiation Oncology | Admitting: Radiation Oncology

## 2014-11-03 DIAGNOSIS — C61 Malignant neoplasm of prostate: Secondary | ICD-10-CM | POA: Diagnosis not present

## 2014-11-03 DIAGNOSIS — Z510 Encounter for antineoplastic radiation therapy: Secondary | ICD-10-CM | POA: Diagnosis not present

## 2014-11-03 DIAGNOSIS — R351 Nocturia: Secondary | ICD-10-CM | POA: Diagnosis not present

## 2014-11-04 ENCOUNTER — Ambulatory Visit
Admission: RE | Admit: 2014-11-04 | Discharge: 2014-11-04 | Disposition: A | Discharge status: Home / Self Care | Payer: Medicare Other | Source: Ambulatory Visit | Attending: Radiation Oncology | Admitting: Radiation Oncology

## 2014-11-04 DIAGNOSIS — R351 Nocturia: Secondary | ICD-10-CM | POA: Diagnosis not present

## 2014-11-04 DIAGNOSIS — C61 Malignant neoplasm of prostate: Secondary | ICD-10-CM | POA: Diagnosis not present

## 2014-11-04 DIAGNOSIS — Z510 Encounter for antineoplastic radiation therapy: Secondary | ICD-10-CM | POA: Diagnosis not present

## 2014-11-05 ENCOUNTER — Ambulatory Visit
Admission: RE | Admit: 2014-11-05 | Discharge: 2014-11-05 | Disposition: A | Discharge status: Home / Self Care | Payer: Medicare Other | Source: Ambulatory Visit | Attending: Radiation Oncology | Admitting: Radiation Oncology

## 2014-11-05 DIAGNOSIS — C61 Malignant neoplasm of prostate: Secondary | ICD-10-CM | POA: Diagnosis not present

## 2014-11-05 DIAGNOSIS — Z510 Encounter for antineoplastic radiation therapy: Secondary | ICD-10-CM | POA: Diagnosis not present

## 2014-11-05 DIAGNOSIS — R351 Nocturia: Secondary | ICD-10-CM | POA: Diagnosis not present

## 2014-11-08 ENCOUNTER — Ambulatory Visit
Admission: RE | Admit: 2014-11-08 | Discharge: 2014-11-08 | Disposition: A | Discharge status: Home / Self Care | Payer: Medicare Other | Source: Ambulatory Visit | Attending: Radiation Oncology | Admitting: Radiation Oncology

## 2014-11-08 DIAGNOSIS — Z510 Encounter for antineoplastic radiation therapy: Secondary | ICD-10-CM | POA: Diagnosis not present

## 2014-11-08 DIAGNOSIS — R351 Nocturia: Secondary | ICD-10-CM | POA: Diagnosis not present

## 2014-11-08 DIAGNOSIS — C61 Malignant neoplasm of prostate: Secondary | ICD-10-CM | POA: Diagnosis not present

## 2014-11-09 ENCOUNTER — Ambulatory Visit
Admission: RE | Admit: 2014-11-09 | Discharge: 2014-11-09 | Disposition: A | Discharge status: Home / Self Care | Payer: Medicare Other | Source: Ambulatory Visit | Attending: Radiation Oncology | Admitting: Radiation Oncology

## 2014-11-09 DIAGNOSIS — Z510 Encounter for antineoplastic radiation therapy: Secondary | ICD-10-CM | POA: Diagnosis not present

## 2014-11-09 DIAGNOSIS — C61 Malignant neoplasm of prostate: Secondary | ICD-10-CM | POA: Diagnosis not present

## 2014-11-09 DIAGNOSIS — R351 Nocturia: Secondary | ICD-10-CM | POA: Diagnosis not present

## 2014-11-10 ENCOUNTER — Ambulatory Visit
Admission: RE | Admit: 2014-11-10 | Discharge: 2014-11-10 | Disposition: A | Discharge status: Home / Self Care | Payer: Medicare Other | Source: Ambulatory Visit | Attending: Radiation Oncology | Admitting: Radiation Oncology

## 2014-11-10 DIAGNOSIS — C61 Malignant neoplasm of prostate: Secondary | ICD-10-CM | POA: Diagnosis not present

## 2014-11-10 DIAGNOSIS — R351 Nocturia: Secondary | ICD-10-CM | POA: Diagnosis not present

## 2014-11-10 DIAGNOSIS — Z510 Encounter for antineoplastic radiation therapy: Secondary | ICD-10-CM | POA: Diagnosis not present

## 2014-11-11 ENCOUNTER — Ambulatory Visit
Admission: RE | Admit: 2014-11-11 | Discharge: 2014-11-11 | Disposition: A | Discharge status: Home / Self Care | Payer: Medicare Other | Source: Ambulatory Visit | Attending: Radiation Oncology | Admitting: Radiation Oncology

## 2014-11-11 DIAGNOSIS — Z510 Encounter for antineoplastic radiation therapy: Secondary | ICD-10-CM | POA: Diagnosis not present

## 2014-11-11 DIAGNOSIS — C61 Malignant neoplasm of prostate: Secondary | ICD-10-CM | POA: Diagnosis not present

## 2014-11-11 DIAGNOSIS — R351 Nocturia: Secondary | ICD-10-CM | POA: Diagnosis not present

## 2014-11-12 ENCOUNTER — Ambulatory Visit
Admission: RE | Admit: 2014-11-12 | Discharge: 2014-11-12 | Disposition: A | Discharge status: Home / Self Care | Payer: Medicare Other | Source: Ambulatory Visit | Attending: Radiation Oncology | Admitting: Radiation Oncology

## 2014-11-12 DIAGNOSIS — C61 Malignant neoplasm of prostate: Secondary | ICD-10-CM | POA: Diagnosis not present

## 2014-11-12 DIAGNOSIS — R351 Nocturia: Secondary | ICD-10-CM | POA: Diagnosis not present

## 2014-11-12 DIAGNOSIS — Z510 Encounter for antineoplastic radiation therapy: Secondary | ICD-10-CM | POA: Diagnosis not present

## 2014-11-15 ENCOUNTER — Ambulatory Visit
Admission: RE | Admit: 2014-11-15 | Discharge: 2014-11-15 | Disposition: A | Discharge status: Home / Self Care | Payer: Medicare Other | Source: Ambulatory Visit | Attending: Radiation Oncology | Admitting: Radiation Oncology

## 2014-11-15 DIAGNOSIS — R351 Nocturia: Secondary | ICD-10-CM | POA: Diagnosis not present

## 2014-11-15 DIAGNOSIS — C61 Malignant neoplasm of prostate: Secondary | ICD-10-CM | POA: Diagnosis not present

## 2014-11-15 DIAGNOSIS — Z510 Encounter for antineoplastic radiation therapy: Secondary | ICD-10-CM | POA: Diagnosis not present

## 2014-11-16 ENCOUNTER — Ambulatory Visit
Admission: RE | Admit: 2014-11-16 | Discharge: 2014-11-16 | Disposition: A | Discharge status: Home / Self Care | Payer: Medicare Other | Source: Ambulatory Visit | Attending: Radiation Oncology | Admitting: Radiation Oncology

## 2014-11-16 DIAGNOSIS — R351 Nocturia: Secondary | ICD-10-CM | POA: Diagnosis not present

## 2014-11-16 DIAGNOSIS — Z510 Encounter for antineoplastic radiation therapy: Secondary | ICD-10-CM | POA: Diagnosis not present

## 2014-11-16 DIAGNOSIS — C61 Malignant neoplasm of prostate: Secondary | ICD-10-CM | POA: Diagnosis not present

## 2014-11-17 ENCOUNTER — Inpatient Hospital Stay: Payer: Medicare Other | Attending: Radiation Oncology

## 2014-11-17 ENCOUNTER — Ambulatory Visit
Admission: RE | Admit: 2014-11-17 | Discharge: 2014-11-17 | Disposition: A | Discharge status: Home / Self Care | Payer: Medicare Other | Source: Ambulatory Visit | Attending: Radiation Oncology | Admitting: Radiation Oncology

## 2014-11-17 DIAGNOSIS — R351 Nocturia: Secondary | ICD-10-CM | POA: Diagnosis not present

## 2014-11-17 DIAGNOSIS — C61 Malignant neoplasm of prostate: Secondary | ICD-10-CM | POA: Diagnosis not present

## 2014-11-17 DIAGNOSIS — Z510 Encounter for antineoplastic radiation therapy: Secondary | ICD-10-CM | POA: Diagnosis not present

## 2014-11-17 LAB — CBC
HCT: 45.6 % (ref 40.0–52.0)
Hemoglobin: 15.4 g/dL (ref 13.0–18.0)
MCH: 27.7 pg (ref 26.0–34.0)
MCHC: 33.7 g/dL (ref 32.0–36.0)
MCV: 82 fL (ref 80.0–100.0)
PLATELETS: 223 10*3/uL (ref 150–440)
RBC: 5.56 MIL/uL (ref 4.40–5.90)
RDW: 13.4 % (ref 11.5–14.5)
WBC: 5.9 10*3/uL (ref 3.8–10.6)

## 2014-11-18 ENCOUNTER — Ambulatory Visit
Admission: RE | Admit: 2014-11-18 | Discharge: 2014-11-18 | Disposition: A | Discharge status: Home / Self Care | Payer: Medicare Other | Source: Ambulatory Visit | Attending: Radiation Oncology | Admitting: Radiation Oncology

## 2014-11-18 DIAGNOSIS — Z510 Encounter for antineoplastic radiation therapy: Secondary | ICD-10-CM | POA: Diagnosis not present

## 2014-11-18 DIAGNOSIS — R351 Nocturia: Secondary | ICD-10-CM | POA: Diagnosis not present

## 2014-11-18 DIAGNOSIS — C61 Malignant neoplasm of prostate: Secondary | ICD-10-CM | POA: Diagnosis not present

## 2014-11-19 ENCOUNTER — Ambulatory Visit
Admission: RE | Admit: 2014-11-19 | Discharge: 2014-11-19 | Disposition: A | Discharge status: Home / Self Care | Payer: Medicare Other | Source: Ambulatory Visit | Attending: Radiation Oncology | Admitting: Radiation Oncology

## 2014-11-19 DIAGNOSIS — R351 Nocturia: Secondary | ICD-10-CM | POA: Diagnosis not present

## 2014-11-19 DIAGNOSIS — Z510 Encounter for antineoplastic radiation therapy: Secondary | ICD-10-CM | POA: Diagnosis not present

## 2014-11-19 DIAGNOSIS — C61 Malignant neoplasm of prostate: Secondary | ICD-10-CM | POA: Diagnosis not present

## 2014-11-22 ENCOUNTER — Ambulatory Visit
Admission: RE | Admit: 2014-11-22 | Discharge: 2014-11-22 | Disposition: A | Discharge status: Home / Self Care | Payer: Medicare Other | Source: Ambulatory Visit | Attending: Radiation Oncology | Admitting: Radiation Oncology

## 2014-11-22 DIAGNOSIS — R351 Nocturia: Secondary | ICD-10-CM | POA: Diagnosis not present

## 2014-11-22 DIAGNOSIS — C61 Malignant neoplasm of prostate: Secondary | ICD-10-CM | POA: Diagnosis not present

## 2014-11-22 DIAGNOSIS — Z510 Encounter for antineoplastic radiation therapy: Secondary | ICD-10-CM | POA: Diagnosis not present

## 2014-11-23 ENCOUNTER — Other Ambulatory Visit: Payer: Self-pay | Admitting: *Deleted

## 2014-11-23 ENCOUNTER — Ambulatory Visit
Admission: RE | Admit: 2014-11-23 | Discharge: 2014-11-23 | Disposition: A | Discharge status: Home / Self Care | Payer: Medicare Other | Source: Ambulatory Visit | Attending: Radiation Oncology | Admitting: Radiation Oncology

## 2014-11-23 DIAGNOSIS — C61 Malignant neoplasm of prostate: Secondary | ICD-10-CM | POA: Diagnosis not present

## 2014-11-23 DIAGNOSIS — Z510 Encounter for antineoplastic radiation therapy: Secondary | ICD-10-CM | POA: Diagnosis not present

## 2014-11-23 DIAGNOSIS — R351 Nocturia: Secondary | ICD-10-CM | POA: Diagnosis not present

## 2014-11-23 MED ORDER — TAMSULOSIN HCL 0.4 MG PO CAPS: 0.4000 mg | ORAL_CAPSULE | Freq: Every day | ORAL | Status: DC

## 2014-11-24 ENCOUNTER — Ambulatory Visit
Admission: RE | Admit: 2014-11-24 | Discharge: 2014-11-24 | Disposition: A | Discharge status: Home / Self Care | Payer: Medicare Other | Source: Ambulatory Visit | Attending: Radiation Oncology | Admitting: Radiation Oncology

## 2014-11-24 DIAGNOSIS — Z510 Encounter for antineoplastic radiation therapy: Secondary | ICD-10-CM | POA: Diagnosis not present

## 2014-11-24 DIAGNOSIS — R351 Nocturia: Secondary | ICD-10-CM | POA: Diagnosis not present

## 2014-11-24 DIAGNOSIS — C61 Malignant neoplasm of prostate: Secondary | ICD-10-CM | POA: Diagnosis not present

## 2014-11-25 ENCOUNTER — Ambulatory Visit
Admission: RE | Admit: 2014-11-25 | Discharge: 2014-11-25 | Disposition: A | Discharge status: Home / Self Care | Payer: Medicare Other | Source: Ambulatory Visit | Attending: Radiation Oncology | Admitting: Radiation Oncology

## 2014-11-25 DIAGNOSIS — C61 Malignant neoplasm of prostate: Secondary | ICD-10-CM | POA: Diagnosis not present

## 2014-11-25 DIAGNOSIS — Z510 Encounter for antineoplastic radiation therapy: Secondary | ICD-10-CM | POA: Diagnosis not present

## 2014-11-25 DIAGNOSIS — R351 Nocturia: Secondary | ICD-10-CM | POA: Diagnosis not present

## 2014-11-26 ENCOUNTER — Ambulatory Visit
Admission: RE | Admit: 2014-11-26 | Discharge: 2014-11-26 | Disposition: A | Discharge status: Home / Self Care | Payer: Medicare Other | Source: Ambulatory Visit | Attending: Radiation Oncology | Admitting: Radiation Oncology

## 2014-11-26 DIAGNOSIS — Z510 Encounter for antineoplastic radiation therapy: Secondary | ICD-10-CM | POA: Diagnosis not present

## 2014-11-26 DIAGNOSIS — C61 Malignant neoplasm of prostate: Secondary | ICD-10-CM | POA: Diagnosis not present

## 2014-11-26 DIAGNOSIS — R351 Nocturia: Secondary | ICD-10-CM | POA: Diagnosis not present

## 2014-11-28 ENCOUNTER — Ambulatory Visit
Admission: RE | Admit: 2014-11-28 | Discharge: 2014-11-28 | Disposition: A | Discharge status: Home / Self Care | Payer: Medicare Other | Source: Ambulatory Visit | Attending: Radiation Oncology | Admitting: Radiation Oncology

## 2014-11-29 ENCOUNTER — Ambulatory Visit
Admission: RE | Admit: 2014-11-29 | Discharge: 2014-11-29 | Disposition: A | Discharge status: Home / Self Care | Payer: Medicare Other | Source: Ambulatory Visit | Attending: Radiation Oncology | Admitting: Radiation Oncology

## 2014-11-29 DIAGNOSIS — R351 Nocturia: Secondary | ICD-10-CM | POA: Diagnosis not present

## 2014-11-29 DIAGNOSIS — Z510 Encounter for antineoplastic radiation therapy: Secondary | ICD-10-CM | POA: Diagnosis not present

## 2014-11-29 DIAGNOSIS — C61 Malignant neoplasm of prostate: Secondary | ICD-10-CM | POA: Diagnosis not present

## 2014-11-30 ENCOUNTER — Ambulatory Visit
Admission: RE | Admit: 2014-11-30 | Discharge: 2014-11-30 | Disposition: A | Discharge status: Home / Self Care | Payer: Medicare Other | Source: Ambulatory Visit | Attending: Radiation Oncology | Admitting: Radiation Oncology

## 2014-11-30 DIAGNOSIS — Z510 Encounter for antineoplastic radiation therapy: Secondary | ICD-10-CM | POA: Diagnosis not present

## 2014-11-30 DIAGNOSIS — C61 Malignant neoplasm of prostate: Secondary | ICD-10-CM | POA: Diagnosis not present

## 2014-11-30 DIAGNOSIS — R351 Nocturia: Secondary | ICD-10-CM | POA: Diagnosis not present

## 2014-12-01 ENCOUNTER — Ambulatory Visit
Admission: RE | Admit: 2014-12-01 | Discharge: 2014-12-01 | Disposition: A | Discharge status: Home / Self Care | Payer: Medicare Other | Source: Ambulatory Visit | Attending: Radiation Oncology | Admitting: Radiation Oncology

## 2014-12-01 ENCOUNTER — Inpatient Hospital Stay: Payer: Medicare Other | Attending: Radiation Oncology

## 2014-12-01 DIAGNOSIS — C61 Malignant neoplasm of prostate: Secondary | ICD-10-CM | POA: Diagnosis not present

## 2014-12-01 DIAGNOSIS — Z510 Encounter for antineoplastic radiation therapy: Secondary | ICD-10-CM | POA: Diagnosis not present

## 2014-12-01 DIAGNOSIS — R351 Nocturia: Secondary | ICD-10-CM | POA: Diagnosis not present

## 2014-12-01 LAB — CBC
HCT: 44.3 % (ref 40.0–52.0)
HEMOGLOBIN: 14.9 g/dL (ref 13.0–18.0)
MCH: 27.7 pg (ref 26.0–34.0)
MCHC: 33.6 g/dL (ref 32.0–36.0)
MCV: 82.6 fL (ref 80.0–100.0)
Platelets: 224 10*3/uL (ref 150–440)
RBC: 5.37 MIL/uL (ref 4.40–5.90)
RDW: 13.8 % (ref 11.5–14.5)
WBC: 6 10*3/uL (ref 3.8–10.6)

## 2014-12-02 ENCOUNTER — Ambulatory Visit
Admission: RE | Admit: 2014-12-02 | Discharge: 2014-12-02 | Disposition: A | Discharge status: Home / Self Care | Payer: Medicare Other | Source: Ambulatory Visit | Attending: Radiation Oncology | Admitting: Radiation Oncology

## 2014-12-02 DIAGNOSIS — C61 Malignant neoplasm of prostate: Secondary | ICD-10-CM | POA: Diagnosis not present

## 2014-12-02 DIAGNOSIS — Z510 Encounter for antineoplastic radiation therapy: Secondary | ICD-10-CM | POA: Diagnosis not present

## 2014-12-02 DIAGNOSIS — R351 Nocturia: Secondary | ICD-10-CM | POA: Diagnosis not present

## 2014-12-03 ENCOUNTER — Ambulatory Visit
Admission: RE | Admit: 2014-12-03 | Discharge: 2014-12-03 | Disposition: A | Discharge status: Home / Self Care | Payer: Medicare Other | Source: Ambulatory Visit | Attending: Radiation Oncology | Admitting: Radiation Oncology

## 2014-12-03 DIAGNOSIS — C61 Malignant neoplasm of prostate: Secondary | ICD-10-CM | POA: Diagnosis not present

## 2014-12-03 DIAGNOSIS — Z510 Encounter for antineoplastic radiation therapy: Secondary | ICD-10-CM | POA: Diagnosis not present

## 2014-12-03 DIAGNOSIS — R351 Nocturia: Secondary | ICD-10-CM | POA: Diagnosis not present

## 2014-12-06 ENCOUNTER — Ambulatory Visit
Admission: RE | Admit: 2014-12-06 | Discharge: 2014-12-06 | Disposition: A | Discharge status: Home / Self Care | Payer: Medicare Other | Source: Ambulatory Visit | Attending: Radiation Oncology | Admitting: Radiation Oncology

## 2014-12-06 DIAGNOSIS — Z510 Encounter for antineoplastic radiation therapy: Secondary | ICD-10-CM | POA: Diagnosis not present

## 2014-12-06 DIAGNOSIS — R351 Nocturia: Secondary | ICD-10-CM | POA: Diagnosis not present

## 2014-12-06 DIAGNOSIS — C61 Malignant neoplasm of prostate: Secondary | ICD-10-CM | POA: Diagnosis not present

## 2014-12-07 ENCOUNTER — Ambulatory Visit
Admission: RE | Admit: 2014-12-07 | Discharge: 2014-12-07 | Disposition: A | Discharge status: Home / Self Care | Payer: Medicare Other | Source: Ambulatory Visit | Attending: Radiation Oncology | Admitting: Radiation Oncology

## 2014-12-07 DIAGNOSIS — I10 Essential (primary) hypertension: Secondary | ICD-10-CM | POA: Diagnosis not present

## 2014-12-07 DIAGNOSIS — E663 Overweight: Secondary | ICD-10-CM | POA: Diagnosis not present

## 2014-12-07 DIAGNOSIS — Z510 Encounter for antineoplastic radiation therapy: Secondary | ICD-10-CM | POA: Diagnosis not present

## 2014-12-07 DIAGNOSIS — C61 Malignant neoplasm of prostate: Secondary | ICD-10-CM | POA: Diagnosis not present

## 2014-12-07 DIAGNOSIS — E785 Hyperlipidemia, unspecified: Secondary | ICD-10-CM | POA: Diagnosis not present

## 2014-12-07 DIAGNOSIS — R351 Nocturia: Secondary | ICD-10-CM | POA: Diagnosis not present

## 2014-12-07 DIAGNOSIS — Z23 Encounter for immunization: Secondary | ICD-10-CM | POA: Diagnosis not present

## 2014-12-08 ENCOUNTER — Ambulatory Visit
Admission: RE | Admit: 2014-12-08 | Discharge: 2014-12-08 | Disposition: A | Discharge status: Home / Self Care | Payer: Medicare Other | Source: Ambulatory Visit | Attending: Radiation Oncology | Admitting: Radiation Oncology

## 2014-12-08 DIAGNOSIS — I10 Essential (primary) hypertension: Secondary | ICD-10-CM | POA: Diagnosis not present

## 2014-12-08 DIAGNOSIS — R351 Nocturia: Secondary | ICD-10-CM | POA: Diagnosis not present

## 2014-12-08 DIAGNOSIS — Z510 Encounter for antineoplastic radiation therapy: Secondary | ICD-10-CM | POA: Diagnosis not present

## 2014-12-08 DIAGNOSIS — C61 Malignant neoplasm of prostate: Secondary | ICD-10-CM | POA: Diagnosis not present

## 2014-12-09 ENCOUNTER — Ambulatory Visit
Admission: RE | Admit: 2014-12-09 | Discharge: 2014-12-09 | Disposition: A | Discharge status: Home / Self Care | Payer: Medicare Other | Source: Ambulatory Visit | Attending: Radiation Oncology | Admitting: Radiation Oncology

## 2014-12-09 DIAGNOSIS — R351 Nocturia: Secondary | ICD-10-CM | POA: Diagnosis not present

## 2014-12-09 DIAGNOSIS — C61 Malignant neoplasm of prostate: Secondary | ICD-10-CM | POA: Diagnosis not present

## 2014-12-09 DIAGNOSIS — Z510 Encounter for antineoplastic radiation therapy: Secondary | ICD-10-CM | POA: Diagnosis not present

## 2014-12-10 ENCOUNTER — Ambulatory Visit
Admission: RE | Admit: 2014-12-10 | Discharge: 2014-12-10 | Disposition: A | Discharge status: Home / Self Care | Payer: Medicare Other | Source: Ambulatory Visit | Attending: Radiation Oncology | Admitting: Radiation Oncology

## 2014-12-10 DIAGNOSIS — R351 Nocturia: Secondary | ICD-10-CM | POA: Diagnosis not present

## 2014-12-10 DIAGNOSIS — Z510 Encounter for antineoplastic radiation therapy: Secondary | ICD-10-CM | POA: Diagnosis not present

## 2014-12-10 DIAGNOSIS — C61 Malignant neoplasm of prostate: Secondary | ICD-10-CM | POA: Diagnosis not present

## 2014-12-13 ENCOUNTER — Ambulatory Visit
Admission: RE | Admit: 2014-12-13 | Discharge: 2014-12-13 | Disposition: A | Discharge status: Home / Self Care | Payer: Medicare Other | Source: Ambulatory Visit | Attending: Radiation Oncology | Admitting: Radiation Oncology

## 2014-12-13 DIAGNOSIS — Z510 Encounter for antineoplastic radiation therapy: Secondary | ICD-10-CM | POA: Diagnosis not present

## 2014-12-13 DIAGNOSIS — C61 Malignant neoplasm of prostate: Secondary | ICD-10-CM | POA: Diagnosis not present

## 2014-12-13 DIAGNOSIS — R351 Nocturia: Secondary | ICD-10-CM | POA: Diagnosis not present

## 2014-12-14 ENCOUNTER — Ambulatory Visit
Admission: RE | Admit: 2014-12-14 | Discharge: 2014-12-14 | Disposition: A | Discharge status: Home / Self Care | Payer: Medicare Other | Source: Ambulatory Visit | Attending: Radiation Oncology | Admitting: Radiation Oncology

## 2014-12-14 DIAGNOSIS — R351 Nocturia: Secondary | ICD-10-CM | POA: Diagnosis not present

## 2014-12-14 DIAGNOSIS — Z510 Encounter for antineoplastic radiation therapy: Secondary | ICD-10-CM | POA: Diagnosis not present

## 2014-12-14 DIAGNOSIS — C61 Malignant neoplasm of prostate: Secondary | ICD-10-CM | POA: Diagnosis not present

## 2014-12-15 ENCOUNTER — Ambulatory Visit
Admission: RE | Admit: 2014-12-15 | Discharge: 2014-12-15 | Disposition: A | Discharge status: Home / Self Care | Payer: Medicare Other | Source: Ambulatory Visit | Attending: Radiation Oncology | Admitting: Radiation Oncology

## 2014-12-15 ENCOUNTER — Inpatient Hospital Stay: Payer: Medicare Other

## 2014-12-15 DIAGNOSIS — C61 Malignant neoplasm of prostate: Secondary | ICD-10-CM

## 2014-12-15 DIAGNOSIS — R351 Nocturia: Secondary | ICD-10-CM | POA: Diagnosis not present

## 2014-12-15 DIAGNOSIS — Z510 Encounter for antineoplastic radiation therapy: Secondary | ICD-10-CM | POA: Diagnosis not present

## 2014-12-15 LAB — CBC
HEMATOCRIT: 43.8 % (ref 40.0–52.0)
HEMOGLOBIN: 14.8 g/dL (ref 13.0–18.0)
MCH: 27.8 pg (ref 26.0–34.0)
MCHC: 33.9 g/dL (ref 32.0–36.0)
MCV: 82.2 fL (ref 80.0–100.0)
Platelets: 228 10*3/uL (ref 150–440)
RBC: 5.32 MIL/uL (ref 4.40–5.90)
RDW: 14 % (ref 11.5–14.5)
WBC: 6.3 10*3/uL (ref 3.8–10.6)

## 2014-12-16 ENCOUNTER — Ambulatory Visit
Admission: RE | Admit: 2014-12-16 | Discharge: 2014-12-16 | Disposition: A | Discharge status: Home / Self Care | Payer: Medicare Other | Source: Ambulatory Visit | Attending: Radiation Oncology | Admitting: Radiation Oncology

## 2014-12-16 DIAGNOSIS — Z510 Encounter for antineoplastic radiation therapy: Secondary | ICD-10-CM | POA: Diagnosis not present

## 2014-12-16 DIAGNOSIS — R351 Nocturia: Secondary | ICD-10-CM | POA: Diagnosis not present

## 2014-12-16 DIAGNOSIS — C61 Malignant neoplasm of prostate: Secondary | ICD-10-CM | POA: Diagnosis not present

## 2014-12-17 ENCOUNTER — Ambulatory Visit
Admission: RE | Admit: 2014-12-17 | Discharge: 2014-12-17 | Disposition: A | Discharge status: Home / Self Care | Payer: Medicare Other | Source: Ambulatory Visit | Attending: Radiation Oncology | Admitting: Radiation Oncology

## 2014-12-17 DIAGNOSIS — R351 Nocturia: Secondary | ICD-10-CM | POA: Diagnosis not present

## 2014-12-17 DIAGNOSIS — Z510 Encounter for antineoplastic radiation therapy: Secondary | ICD-10-CM | POA: Diagnosis not present

## 2014-12-17 DIAGNOSIS — C61 Malignant neoplasm of prostate: Secondary | ICD-10-CM | POA: Diagnosis not present

## 2014-12-20 ENCOUNTER — Ambulatory Visit
Admission: RE | Admit: 2014-12-20 | Discharge: 2014-12-20 | Disposition: A | Discharge status: Home / Self Care | Payer: Medicare Other | Source: Ambulatory Visit | Attending: Radiation Oncology | Admitting: Radiation Oncology

## 2014-12-20 DIAGNOSIS — C61 Malignant neoplasm of prostate: Secondary | ICD-10-CM | POA: Diagnosis not present

## 2014-12-20 DIAGNOSIS — Z510 Encounter for antineoplastic radiation therapy: Secondary | ICD-10-CM | POA: Diagnosis not present

## 2014-12-20 DIAGNOSIS — R351 Nocturia: Secondary | ICD-10-CM | POA: Diagnosis not present

## 2014-12-21 ENCOUNTER — Ambulatory Visit
Admission: RE | Admit: 2014-12-21 | Discharge: 2014-12-21 | Disposition: A | Discharge status: Home / Self Care | Payer: Medicare Other | Source: Ambulatory Visit | Attending: Radiation Oncology | Admitting: Radiation Oncology

## 2014-12-21 DIAGNOSIS — R351 Nocturia: Secondary | ICD-10-CM | POA: Diagnosis not present

## 2014-12-21 DIAGNOSIS — Z510 Encounter for antineoplastic radiation therapy: Secondary | ICD-10-CM | POA: Diagnosis not present

## 2014-12-21 DIAGNOSIS — C61 Malignant neoplasm of prostate: Secondary | ICD-10-CM | POA: Diagnosis not present

## 2014-12-22 ENCOUNTER — Ambulatory Visit
Admission: RE | Admit: 2014-12-22 | Discharge: 2014-12-22 | Disposition: A | Discharge status: Home / Self Care | Payer: Medicare Other | Source: Ambulatory Visit | Attending: Radiation Oncology | Admitting: Radiation Oncology

## 2014-12-22 DIAGNOSIS — C61 Malignant neoplasm of prostate: Secondary | ICD-10-CM | POA: Diagnosis not present

## 2014-12-22 DIAGNOSIS — R351 Nocturia: Secondary | ICD-10-CM | POA: Diagnosis not present

## 2014-12-22 DIAGNOSIS — Z510 Encounter for antineoplastic radiation therapy: Secondary | ICD-10-CM | POA: Diagnosis not present

## 2014-12-27 ENCOUNTER — Ambulatory Visit
Admission: RE | Admit: 2014-12-27 | Discharge: 2014-12-27 | Disposition: A | Discharge status: Home / Self Care | Payer: Medicare Other | Source: Ambulatory Visit | Attending: Radiation Oncology | Admitting: Radiation Oncology

## 2014-12-27 DIAGNOSIS — C61 Malignant neoplasm of prostate: Secondary | ICD-10-CM | POA: Diagnosis not present

## 2014-12-27 DIAGNOSIS — R351 Nocturia: Secondary | ICD-10-CM | POA: Diagnosis not present

## 2014-12-27 DIAGNOSIS — Z510 Encounter for antineoplastic radiation therapy: Secondary | ICD-10-CM | POA: Diagnosis not present

## 2014-12-28 ENCOUNTER — Ambulatory Visit
Admission: RE | Admit: 2014-12-28 | Discharge: 2014-12-28 | Disposition: A | Discharge status: Home / Self Care | Payer: Medicare Other | Source: Ambulatory Visit | Attending: Radiation Oncology | Admitting: Radiation Oncology

## 2014-12-28 DIAGNOSIS — R351 Nocturia: Secondary | ICD-10-CM | POA: Diagnosis not present

## 2014-12-28 DIAGNOSIS — Z510 Encounter for antineoplastic radiation therapy: Secondary | ICD-10-CM | POA: Diagnosis not present

## 2014-12-28 DIAGNOSIS — C61 Malignant neoplasm of prostate: Secondary | ICD-10-CM | POA: Diagnosis not present

## 2014-12-29 ENCOUNTER — Ambulatory Visit
Admission: RE | Admit: 2014-12-29 | Discharge: 2014-12-29 | Disposition: A | Discharge status: Home / Self Care | Payer: Medicare Other | Source: Ambulatory Visit | Attending: Radiation Oncology | Admitting: Radiation Oncology

## 2014-12-29 DIAGNOSIS — Z510 Encounter for antineoplastic radiation therapy: Secondary | ICD-10-CM | POA: Diagnosis not present

## 2014-12-29 DIAGNOSIS — C61 Malignant neoplasm of prostate: Secondary | ICD-10-CM | POA: Diagnosis not present

## 2014-12-29 DIAGNOSIS — R351 Nocturia: Secondary | ICD-10-CM | POA: Diagnosis not present

## 2014-12-30 ENCOUNTER — Ambulatory Visit
Admission: RE | Admit: 2014-12-30 | Discharge: 2014-12-30 | Disposition: A | Discharge status: Home / Self Care | Payer: Medicare Other | Source: Ambulatory Visit | Attending: Radiation Oncology | Admitting: Radiation Oncology

## 2014-12-30 DIAGNOSIS — Z510 Encounter for antineoplastic radiation therapy: Secondary | ICD-10-CM | POA: Diagnosis not present

## 2014-12-30 DIAGNOSIS — C61 Malignant neoplasm of prostate: Secondary | ICD-10-CM | POA: Diagnosis not present

## 2014-12-30 DIAGNOSIS — R351 Nocturia: Secondary | ICD-10-CM | POA: Diagnosis not present

## 2015-02-03 ENCOUNTER — Other Ambulatory Visit: Payer: Self-pay | Admitting: *Deleted

## 2015-02-03 ENCOUNTER — Encounter: Payer: Self-pay | Admitting: Radiation Oncology

## 2015-02-03 ENCOUNTER — Ambulatory Visit
Admission: RE | Admit: 2015-02-03 | Discharge: 2015-02-03 | Disposition: A | Discharge status: Home / Self Care | Payer: Medicare Other | Source: Ambulatory Visit | Attending: Radiation Oncology | Admitting: Radiation Oncology

## 2015-02-03 VITALS — BP 164/106 | HR 78 | Temp 96.0°F | Resp 18 | Wt 197.5 lb

## 2015-02-03 DIAGNOSIS — C61 Malignant neoplasm of prostate: Secondary | ICD-10-CM

## 2015-02-03 MED ORDER — SILDENAFIL CITRATE 20 MG PO TABS: 20.0000 mg | ORAL_TABLET | Freq: Every day | ORAL | Status: DC | PRN

## 2015-02-03 MED ORDER — VARDENAFIL HCL 10 MG PO TABS: 10.0000 mg | ORAL_TABLET | Freq: Every day | ORAL | Status: DC | PRN

## 2015-02-03 NOTE — Progress Notes (Signed)
Radiation Oncology Follow up Note  Name: Darrell Harrison.   Date:   02/03/2015 MRN:  [id number] DOB: 1945/09/21    This 70 y.o. male presents to the clinic today for follow-up for prostate cancer stage IIB presenting with a PSA of 23 Gleason 6 now out 1 month. Of I am RT treatment planning and delivery  REFERRING PROVIDER: Raynelle Bring, MD  HPI: Patient is a 70 year old male now one month out having completed image guided I MRT radiation therapy for Gleason 6 (3+3) adenocarcinoma the prostate presenting with a PSA of. 23. He is seen today in routine follow-up and is doing well. He specifically denies any lower urinary tract symptoms diarrhea or dysuria. He has been having some erectile dysfunction issues and is requested some intervention for that.  COMPLICATIONS OF TREATMENT: none  FOLLOW UP COMPLIANCE: keeps appointments   PHYSICAL EXAM:  BP 164/106 mmHg  Pulse 78  Temp(Src) 96 F (35.6 C)  Resp 18  Wt 197 lb 8.5 oz (89.6 kg) On rectal exam rectal sphincter tone is good. Prostate is smooth contracted without evidence of nodularity or mass. Sulcus is preserved bilaterally. No discrete nodularity is identified. No other rectal abnormalities are noted. Well-developed well-nourished patient in NAD. HEENT reveals PERLA, EOMI, discs not visualized.  Oral cavity is clear. No oral mucosal lesions are identified. Neck is clear without evidence of cervical or supraclavicular adenopathy. Lungs are clear to A&P. Cardiac examination is essentially unremarkable with regular rate and rhythm without murmur rub or thrill. Abdomen is benign with no organomegaly or masses noted. Motor sensory and DTR levels are equal and symmetric in the upper and lower extremities. Cranial nerves II through XII are grossly intact. Proprioception is intact. No peripheral adenopathy or edema is identified. No motor or sensory levels are noted. Crude visual fields are within normal range.  RADIOLOGY RESULTS: No current  films for review  PLAN: Present times recovering nicely from his radiation therapy treatments. Having very little symptoms at this time. I'll wait another 4 months to perform a PSA and I've set up a follow-up appointment that time. Patient knows to call sooner with any concerns. He continues to do extremely well.  Macro thanks    Armstead Peaks., MD

## 2015-04-21 DIAGNOSIS — Z0000 Encounter for general adult medical examination without abnormal findings: Secondary | ICD-10-CM | POA: Diagnosis not present

## 2015-04-21 DIAGNOSIS — E785 Hyperlipidemia, unspecified: Secondary | ICD-10-CM | POA: Diagnosis not present

## 2015-04-21 DIAGNOSIS — Z23 Encounter for immunization: Secondary | ICD-10-CM | POA: Diagnosis not present

## 2015-04-21 DIAGNOSIS — Z6830 Body mass index (BMI) 30.0-30.9, adult: Secondary | ICD-10-CM | POA: Diagnosis not present

## 2015-04-21 DIAGNOSIS — I10 Essential (primary) hypertension: Secondary | ICD-10-CM | POA: Diagnosis not present

## 2015-04-21 DIAGNOSIS — C61 Malignant neoplasm of prostate: Secondary | ICD-10-CM | POA: Diagnosis not present

## 2015-05-20 DIAGNOSIS — C61 Malignant neoplasm of prostate: Secondary | ICD-10-CM | POA: Diagnosis not present

## 2015-05-20 DIAGNOSIS — Z0000 Encounter for general adult medical examination without abnormal findings: Secondary | ICD-10-CM | POA: Diagnosis not present

## 2015-05-30 ENCOUNTER — Other Ambulatory Visit: Payer: Self-pay | Admitting: *Deleted

## 2015-06-03 ENCOUNTER — Inpatient Hospital Stay: Payer: Medicare Other | Attending: Radiation Oncology

## 2015-06-03 ENCOUNTER — Encounter: Payer: Self-pay | Admitting: Radiation Oncology

## 2015-06-03 ENCOUNTER — Other Ambulatory Visit: Payer: Self-pay | Admitting: *Deleted

## 2015-06-03 ENCOUNTER — Ambulatory Visit
Admission: RE | Admit: 2015-06-03 | Discharge: 2015-06-03 | Disposition: A | Discharge status: Home / Self Care | Payer: Medicare Other | Source: Ambulatory Visit | Attending: Radiation Oncology | Admitting: Radiation Oncology

## 2015-06-03 VITALS — BP 147/98 | HR 69 | Temp 96.9°F | Resp 18 | Wt 192.4 lb

## 2015-06-03 DIAGNOSIS — C61 Malignant neoplasm of prostate: Secondary | ICD-10-CM

## 2015-06-03 LAB — PSA: PSA: 0.4 ng/mL (ref 0.00–4.00)

## 2015-06-03 MED ORDER — SILDENAFIL CITRATE 20 MG PO TABS: 20.0000 mg | ORAL_TABLET | Freq: Every day | ORAL | Status: DC | PRN

## 2015-06-03 NOTE — Progress Notes (Signed)
Radiation Oncology Follow up Note  Name: Darrell Harrison.   Date:   06/03/2015 MRN:  [id number] DOB: 1945/04/16    This 70 y.o. male presents to the clinic today for follow-up for prostate cancer stage IIB now out 5 months from image guided I MRT radiation therapy.  REFERRING PROVIDER: Raynelle Bring, MD  HPI: Patient is a 70 year old male with stage IIB (T1 CN 0 M0) adenocarcinoma prostate presenting the PSA of 23 and a Gleason score of 6 (3+3).Marland Kitchen He received 8000 cGy over 8 weeks to his prostate is seen today in routine follow-up and is doing well. We've run a PSA level on him today. He specifically denies any lower urinary tract symptoms any diarrhea or rectal bleeding. Still having some erectile dysfunction which predates his treatment and we are refilling today his Viagra.  COMPLICATIONS OF TREATMENT: none  FOLLOW UP COMPLIANCE: keeps appointments   PHYSICAL EXAM:  BP 147/98 mmHg  Pulse 69  Temp(Src) 96.9 F (36.1 C)  Resp 18  Wt 192 lb 5.6 oz (87.25 kg) On rectal exam rectal sphincter tone is good. Prostate is smooth contracted without evidence of nodularity or mass. Sulcus is preserved bilaterally. No discrete nodularity is identified. No other rectal abnormalities are noted. Well-developed well-nourished patient in NAD. HEENT reveals PERLA, EOMI, discs not visualized.  Oral cavity is clear. No oral mucosal lesions are identified. Neck is clear without evidence of cervical or supraclavicular adenopathy. Lungs are clear to A&P. Cardiac examination is essentially unremarkable with regular rate and rhythm without murmur rub or thrill. Abdomen is benign with no organomegaly or masses noted. Motor sensory and DTR levels are equal and symmetric in the upper and lower extremities. Cranial nerves II through XII are grossly intact. Proprioception is intact. No peripheral adenopathy or edema is identified. No motor or sensory levels are noted. Crude visual fields are within normal  range.  RADIOLOGY RESULTS:  no current films for review  PLAN: Will report hisPSA separately. Also we've discussed his erectile dysfunction and again trying to arrange for discount on his Viagra. Otherwise I've asked to see him back in 6 months for follow-up. Patient knows to call with any concerns.  I would like to take this opportunity for allowing me to participate in the care of your patient.Armstead Peaks., MD

## 2015-07-28 DIAGNOSIS — I10 Essential (primary) hypertension: Secondary | ICD-10-CM | POA: Diagnosis not present

## 2015-07-28 DIAGNOSIS — E785 Hyperlipidemia, unspecified: Secondary | ICD-10-CM | POA: Diagnosis not present

## 2015-10-24 ENCOUNTER — Other Ambulatory Visit: Payer: Self-pay | Admitting: *Deleted

## 2015-10-24 MED ORDER — TAMSULOSIN HCL 0.4 MG PO CAPS: 0.4000 mg | ORAL_CAPSULE | Freq: Every day | ORAL | 11 refills | Status: DC

## 2015-11-22 DIAGNOSIS — I10 Essential (primary) hypertension: Secondary | ICD-10-CM | POA: Diagnosis not present

## 2015-11-22 DIAGNOSIS — Z23 Encounter for immunization: Secondary | ICD-10-CM | POA: Diagnosis not present

## 2015-11-22 DIAGNOSIS — Z6830 Body mass index (BMI) 30.0-30.9, adult: Secondary | ICD-10-CM | POA: Diagnosis not present

## 2015-11-22 DIAGNOSIS — G479 Sleep disorder, unspecified: Secondary | ICD-10-CM | POA: Diagnosis not present

## 2015-11-22 DIAGNOSIS — E785 Hyperlipidemia, unspecified: Secondary | ICD-10-CM | POA: Diagnosis not present

## 2015-11-25 DIAGNOSIS — C61 Malignant neoplasm of prostate: Secondary | ICD-10-CM | POA: Diagnosis not present

## 2015-11-25 DIAGNOSIS — Z5111 Encounter for antineoplastic chemotherapy: Secondary | ICD-10-CM | POA: Diagnosis not present

## 2015-12-07 ENCOUNTER — Other Ambulatory Visit: Payer: Self-pay | Admitting: *Deleted

## 2015-12-07 DIAGNOSIS — C61 Malignant neoplasm of prostate: Secondary | ICD-10-CM

## 2015-12-08 ENCOUNTER — Inpatient Hospital Stay: Payer: Medicare HMO | Attending: Radiation Oncology

## 2015-12-08 ENCOUNTER — Ambulatory Visit
Admission: RE | Admit: 2015-12-08 | Discharge: 2015-12-08 | Disposition: A | Discharge status: Home / Self Care | Payer: Medicare HMO | Source: Ambulatory Visit | Attending: Radiation Oncology | Admitting: Radiation Oncology

## 2015-12-08 ENCOUNTER — Encounter: Payer: Self-pay | Admitting: Radiation Oncology

## 2015-12-08 VITALS — BP 153/100 | HR 79 | Temp 98.0°F | Wt 197.8 lb

## 2015-12-08 DIAGNOSIS — Z923 Personal history of irradiation: Secondary | ICD-10-CM | POA: Diagnosis not present

## 2015-12-08 DIAGNOSIS — R350 Frequency of micturition: Secondary | ICD-10-CM | POA: Insufficient documentation

## 2015-12-08 DIAGNOSIS — R351 Nocturia: Secondary | ICD-10-CM | POA: Diagnosis not present

## 2015-12-08 DIAGNOSIS — Z8546 Personal history of malignant neoplasm of prostate: Secondary | ICD-10-CM | POA: Diagnosis present

## 2015-12-08 DIAGNOSIS — C61 Malignant neoplasm of prostate: Secondary | ICD-10-CM | POA: Diagnosis present

## 2015-12-08 LAB — PSA: PSA: 0.11 ng/mL (ref 0.00–4.00)

## 2015-12-08 NOTE — Progress Notes (Signed)
Radiation Oncology Follow up Note  Name: Darrell Harrison.   Date:   12/08/2015 MRN:  [id number] DOB: 1945/03/06    This 70 y.o. male presents to the clinic today for 11 month follow-up status post I MRT radiation therapy for stage IIB adenocarcinoma the prostate.  REFERRING PROVIDER: Raynelle Bring, MD  HPI: Patient is a 70 year old male now out 11 months having completed IM RT radiation therapy for stage IIB (T1 cN0 M0) adenocarcinoma the prostate presenting with a PSA of 23 and a Gleason score of 6.. His last PSA 6 months prior was 0.4 we have repeated that today. He does have some urinary frequency and nocturia 4 only takes Flomax on occasion I've asked him to start taking on a daily basis and will provide refills. He specifically denies any significant diarrhea.  COMPLICATIONS OF TREATMENT: none  FOLLOW UP COMPLIANCE: keeps appointments   PHYSICAL EXAM:  BP (!) 153/100   Pulse 79   Temp 98 F (36.7 C)   Wt 197 lb 12 oz (89.7 kg)   BMI 30.07 kg/m  On rectal exam rectal sphincter tone is good. Prostate is smooth contracted without evidence of nodularity or mass. Sulcus is preserved bilaterally. No discrete nodularity is identified. No other rectal abnormalities are noted. Well-developed well-nourished patient in NAD. HEENT reveals PERLA, EOMI, discs not visualized.  Oral cavity is clear. No oral mucosal lesions are identified. Neck is clear without evidence of cervical or supraclavicular adenopathy. Lungs are clear to A&P. Cardiac examination is essentially unremarkable with regular rate and rhythm without murmur rub or thrill. Abdomen is benign with no organomegaly or masses noted. Motor sensory and DTR levels are equal and symmetric in the upper and lower extremities. Cranial nerves II through XII are grossly intact. Proprioception is intact. No peripheral adenopathy or edema is identified. No motor or sensory levels are noted. Crude visual fields are within normal  range.  RADIOLOGY RESULTS: No current films for review  PLAN: At the present time patient is doing well with no evidence of disease. I have repeated his PSA and will report that separately. Otherwise I'm please was overall progress. I've asked to see him back in 6 months for follow-up and will repeat PSA at that time. Again I stressed the use of Flomax on a continual basis.  I would like to take this opportunity to thank you for allowing me to participate in the care of your patient.Armstead Peaks., MD

## 2016-06-08 ENCOUNTER — Encounter: Payer: Self-pay | Admitting: *Deleted

## 2016-11-23 ENCOUNTER — Other Ambulatory Visit: Payer: Self-pay | Admitting: *Deleted

## 2016-11-23 DIAGNOSIS — C61 Malignant neoplasm of prostate: Secondary | ICD-10-CM

## 2016-12-25 ENCOUNTER — Inpatient Hospital Stay: Payer: Medicare HMO | Attending: Radiation Oncology

## 2016-12-27 ENCOUNTER — Ambulatory Visit: Payer: Medicare HMO | Admitting: Radiation Oncology

## 2017-01-04 ENCOUNTER — Other Ambulatory Visit: Payer: Self-pay

## 2017-01-04 ENCOUNTER — Ambulatory Visit
Admission: RE | Admit: 2017-01-04 | Discharge: 2017-01-04 | Disposition: A | Discharge status: Home / Self Care | Payer: Medicare HMO | Source: Ambulatory Visit | Attending: Radiation Oncology | Admitting: Radiation Oncology

## 2017-01-04 ENCOUNTER — Inpatient Hospital Stay: Payer: Medicare HMO | Attending: Radiation Oncology

## 2017-01-04 ENCOUNTER — Encounter: Payer: Self-pay | Admitting: Radiation Oncology

## 2017-01-04 ENCOUNTER — Other Ambulatory Visit: Payer: Self-pay | Admitting: *Deleted

## 2017-01-04 VITALS — BP 153/97 | HR 87 | Temp 96.7°F | Resp 20 | Wt 201.6 lb

## 2017-01-04 DIAGNOSIS — C61 Malignant neoplasm of prostate: Secondary | ICD-10-CM | POA: Diagnosis present

## 2017-01-04 DIAGNOSIS — Z923 Personal history of irradiation: Secondary | ICD-10-CM | POA: Diagnosis not present

## 2017-01-04 LAB — PSA: Prostatic Specific Antigen: 0.23 ng/mL (ref 0.00–4.00)

## 2017-01-04 NOTE — Progress Notes (Signed)
Radiation Oncology Follow up Note  Name: Darrell Harrison.   Date:   01/04/2017 MRN:  [id number] DOB: [birth year]    This 71 y.o. male presents to the clinic today for a 2 year follow-up status post I MRT radiation therapy for stage IIB adenocarcinoma the prostate.  REFERRING PROVIDER: Rosita Fire, MD  HPI: Patient is a 71 year old male now out 2 years having completed IM RT radiation to his prostate for a Gleason 6 adenocarcinoma presenting the PSA of 23. He is seen today in routine follow-up and is doing well. He is stopped taking Flomax. He specifically denies diarrhea dysuria or any other GI/GU complaints. Last PSA 1 year prior was 0.11. We have redrawn his PSA today..  COMPLICATIONS OF TREATMENT: none  FOLLOW UP COMPLIANCE: keeps appointments   PHYSICAL EXAM:  BP (!) 153/97   Pulse 87   Temp (!) 96.7 F (35.9 C)   Resp 20   Wt 201 lb 9.8 oz (91.4 kg)   BMI 30.65 kg/m  On rectal exam rectal sphincter tone is good. Prostate is smooth contracted without evidence of nodularity or mass. Sulcus is preserved bilaterally. No discrete nodularity is identified. No other rectal abnormalities are noted. Well-developed well-nourished patient in NAD. HEENT reveals PERLA, EOMI, discs not visualized.  Oral cavity is clear. No oral mucosal lesions are identified. Neck is clear without evidence of cervical or supraclavicular adenopathy. Lungs are clear to A&P. Cardiac examination is essentially unremarkable with regular rate and rhythm without murmur rub or thrill. Abdomen is benign with no organomegaly or masses noted. Motor sensory and DTR levels are equal and symmetric in the upper and lower extremities. Cranial nerves II through XII are grossly intact. Proprioception is intact. No peripheral adenopathy or edema is identified. No motor or sensory levels are noted. Crude visual fields are within normal range.  RADIOLOGY RESULTS: No current films for review  PLAN: At the present time patient is  doing well. I have run a PSA level today and report that separately. He otherwise has very little side effects from his prior treatment. I've asked to see him back in 1 year for follow-up and will obtain a PSA prior to that visit. Patient knows to call sooner with any concerns.  I would like to take this opportunity to thank you for allowing me to participate in the care of your patient.Armstead Peaks., MD

## 2017-06-29 ENCOUNTER — Emergency Department (HOSPITAL_COMMUNITY): Payer: Medicare HMO

## 2017-06-29 ENCOUNTER — Inpatient Hospital Stay (HOSPITAL_COMMUNITY)
Admission: EM | Admit: 2017-06-29 | Discharge: 2017-07-05 | DRG: 958 | Disposition: A | Discharge status: Rehabilitation Facility | Payer: Medicare HMO | Attending: General Surgery | Admitting: General Surgery

## 2017-06-29 DIAGNOSIS — T148XXA Other injury of unspecified body region, initial encounter: Secondary | ICD-10-CM

## 2017-06-29 DIAGNOSIS — Z79899 Other long term (current) drug therapy: Secondary | ICD-10-CM | POA: Diagnosis not present

## 2017-06-29 DIAGNOSIS — Z923 Personal history of irradiation: Secondary | ICD-10-CM | POA: Diagnosis not present

## 2017-06-29 DIAGNOSIS — Z8546 Personal history of malignant neoplasm of prostate: Secondary | ICD-10-CM

## 2017-06-29 DIAGNOSIS — R402252 Coma scale, best verbal response, oriented, at arrival to emergency department: Secondary | ICD-10-CM | POA: Diagnosis present

## 2017-06-29 DIAGNOSIS — S73004A Unspecified dislocation of right hip, initial encounter: Secondary | ICD-10-CM

## 2017-06-29 DIAGNOSIS — D62 Acute posthemorrhagic anemia: Secondary | ICD-10-CM | POA: Diagnosis not present

## 2017-06-29 DIAGNOSIS — S32421A Displaced fracture of posterior wall of right acetabulum, initial encounter for closed fracture: Secondary | ICD-10-CM | POA: Diagnosis not present

## 2017-06-29 DIAGNOSIS — S73014A Posterior dislocation of right hip, initial encounter: Secondary | ICD-10-CM | POA: Diagnosis present

## 2017-06-29 DIAGNOSIS — Z8249 Family history of ischemic heart disease and other diseases of the circulatory system: Secondary | ICD-10-CM | POA: Diagnosis not present

## 2017-06-29 DIAGNOSIS — R0602 Shortness of breath: Secondary | ICD-10-CM | POA: Diagnosis not present

## 2017-06-29 DIAGNOSIS — D72829 Elevated white blood cell count, unspecified: Secondary | ICD-10-CM | POA: Diagnosis not present

## 2017-06-29 DIAGNOSIS — Z823 Family history of stroke: Secondary | ICD-10-CM

## 2017-06-29 DIAGNOSIS — Y92410 Unspecified street and highway as the place of occurrence of the external cause: Secondary | ICD-10-CM

## 2017-06-29 DIAGNOSIS — S32401D Unspecified fracture of right acetabulum, subsequent encounter for fracture with routine healing: Secondary | ICD-10-CM | POA: Diagnosis present

## 2017-06-29 DIAGNOSIS — S2241XD Multiple fractures of ribs, right side, subsequent encounter for fracture with routine healing: Secondary | ICD-10-CM | POA: Diagnosis not present

## 2017-06-29 DIAGNOSIS — S199XXA Unspecified injury of neck, initial encounter: Secondary | ICD-10-CM | POA: Diagnosis not present

## 2017-06-29 DIAGNOSIS — S36892A Contusion of other intra-abdominal organs, initial encounter: Secondary | ICD-10-CM | POA: Diagnosis present

## 2017-06-29 DIAGNOSIS — M898X9 Other specified disorders of bone, unspecified site: Secondary | ICD-10-CM | POA: Diagnosis present

## 2017-06-29 DIAGNOSIS — Z419 Encounter for procedure for purposes other than remedying health state, unspecified: Secondary | ICD-10-CM

## 2017-06-29 DIAGNOSIS — G8918 Other acute postprocedural pain: Secondary | ICD-10-CM | POA: Diagnosis not present

## 2017-06-29 DIAGNOSIS — M879 Osteonecrosis, unspecified: Secondary | ICD-10-CM | POA: Diagnosis present

## 2017-06-29 DIAGNOSIS — R4020 Unspecified coma: Secondary | ICD-10-CM | POA: Diagnosis not present

## 2017-06-29 DIAGNOSIS — R402142 Coma scale, eyes open, spontaneous, at arrival to emergency department: Secondary | ICD-10-CM | POA: Diagnosis present

## 2017-06-29 DIAGNOSIS — R7981 Abnormal blood-gas level: Secondary | ICD-10-CM

## 2017-06-29 DIAGNOSIS — S2222XA Fracture of body of sternum, initial encounter for closed fracture: Secondary | ICD-10-CM | POA: Diagnosis present

## 2017-06-29 DIAGNOSIS — F1010 Alcohol abuse, uncomplicated: Secondary | ICD-10-CM | POA: Diagnosis present

## 2017-06-29 DIAGNOSIS — Z9981 Dependence on supplemental oxygen: Secondary | ICD-10-CM

## 2017-06-29 DIAGNOSIS — R269 Unspecified abnormalities of gait and mobility: Secondary | ICD-10-CM | POA: Diagnosis not present

## 2017-06-29 DIAGNOSIS — S3991XA Unspecified injury of abdomen, initial encounter: Secondary | ICD-10-CM | POA: Diagnosis not present

## 2017-06-29 DIAGNOSIS — S2249XA Multiple fractures of ribs, unspecified side, initial encounter for closed fracture: Secondary | ICD-10-CM

## 2017-06-29 DIAGNOSIS — S32461A Displaced associated transverse-posterior fracture of right acetabulum, initial encounter for closed fracture: Principal | ICD-10-CM | POA: Diagnosis present

## 2017-06-29 DIAGNOSIS — E785 Hyperlipidemia, unspecified: Secondary | ICD-10-CM | POA: Diagnosis present

## 2017-06-29 DIAGNOSIS — Z809 Family history of malignant neoplasm, unspecified: Secondary | ICD-10-CM | POA: Diagnosis not present

## 2017-06-29 DIAGNOSIS — S32401A Unspecified fracture of right acetabulum, initial encounter for closed fracture: Secondary | ICD-10-CM | POA: Diagnosis not present

## 2017-06-29 DIAGNOSIS — M6149 Other calcification of muscle, multiple sites: Secondary | ICD-10-CM | POA: Diagnosis not present

## 2017-06-29 DIAGNOSIS — N179 Acute kidney failure, unspecified: Secondary | ICD-10-CM | POA: Diagnosis not present

## 2017-06-29 DIAGNOSIS — Z8042 Family history of malignant neoplasm of prostate: Secondary | ICD-10-CM | POA: Diagnosis not present

## 2017-06-29 DIAGNOSIS — M87051 Idiopathic aseptic necrosis of right femur: Secondary | ICD-10-CM | POA: Diagnosis not present

## 2017-06-29 DIAGNOSIS — E559 Vitamin D deficiency, unspecified: Secondary | ICD-10-CM | POA: Diagnosis present

## 2017-06-29 DIAGNOSIS — S32441D Displaced fracture of posterior column [ilioischial] of right acetabulum, subsequent encounter for fracture with routine healing: Secondary | ICD-10-CM | POA: Diagnosis not present

## 2017-06-29 DIAGNOSIS — Z7141 Alcohol abuse counseling and surveillance of alcoholic: Secondary | ICD-10-CM | POA: Diagnosis not present

## 2017-06-29 DIAGNOSIS — S2241XA Multiple fractures of ribs, right side, initial encounter for closed fracture: Secondary | ICD-10-CM | POA: Diagnosis present

## 2017-06-29 DIAGNOSIS — I10 Essential (primary) hypertension: Secondary | ICD-10-CM | POA: Diagnosis not present

## 2017-06-29 DIAGNOSIS — R402362 Coma scale, best motor response, obeys commands, at arrival to emergency department: Secondary | ICD-10-CM | POA: Diagnosis present

## 2017-06-29 DIAGNOSIS — C61 Malignant neoplasm of prostate: Secondary | ICD-10-CM

## 2017-06-29 DIAGNOSIS — S32441S Displaced fracture of posterior column [ilioischial] of right acetabulum, sequela: Secondary | ICD-10-CM | POA: Diagnosis not present

## 2017-06-29 DIAGNOSIS — M2341 Loose body in knee, right knee: Secondary | ICD-10-CM | POA: Diagnosis not present

## 2017-06-29 DIAGNOSIS — S2220XD Unspecified fracture of sternum, subsequent encounter for fracture with routine healing: Secondary | ICD-10-CM | POA: Diagnosis not present

## 2017-06-29 DIAGNOSIS — G479 Sleep disorder, unspecified: Secondary | ICD-10-CM | POA: Diagnosis not present

## 2017-06-29 DIAGNOSIS — R3915 Urgency of urination: Secondary | ICD-10-CM | POA: Diagnosis present

## 2017-06-29 DIAGNOSIS — K661 Hemoperitoneum: Secondary | ICD-10-CM

## 2017-06-29 DIAGNOSIS — R0902 Hypoxemia: Secondary | ICD-10-CM | POA: Diagnosis not present

## 2017-06-29 DIAGNOSIS — E8809 Other disorders of plasma-protein metabolism, not elsewhere classified: Secondary | ICD-10-CM | POA: Diagnosis present

## 2017-06-29 DIAGNOSIS — E46 Unspecified protein-calorie malnutrition: Secondary | ICD-10-CM | POA: Diagnosis not present

## 2017-06-29 DIAGNOSIS — F10929 Alcohol use, unspecified with intoxication, unspecified: Secondary | ICD-10-CM | POA: Diagnosis not present

## 2017-06-29 DIAGNOSIS — W19XXXA Unspecified fall, initial encounter: Secondary | ICD-10-CM | POA: Diagnosis not present

## 2017-06-29 DIAGNOSIS — Z298 Encounter for other specified prophylactic measures: Secondary | ICD-10-CM | POA: Diagnosis not present

## 2017-06-29 DIAGNOSIS — M7989 Other specified soft tissue disorders: Secondary | ICD-10-CM | POA: Diagnosis not present

## 2017-06-29 DIAGNOSIS — S069X9A Unspecified intracranial injury with loss of consciousness of unspecified duration, initial encounter: Secondary | ICD-10-CM | POA: Diagnosis not present

## 2017-06-29 HISTORY — DX: Posterior dislocation of right hip, initial encounter: S73.014A
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
## 2017-06-29 NOTE — ED Provider Notes (Signed)
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 arrival to the ER.  He did have deformity noted on exam.  X-ray confirmed right hip dislocation with acetabular fracture.  Chest x-ray also raise suspicion for multiple rib fractures without contusion or pneumothorax.  Patient probably had his hip reduced by myself.  He then went for trauma scans which confirmed right second through seventh rib fractures without hematoma or pneumothorax.  There is a sternal fracture.  He has a retroperitoneal hematoma.  Patient has been stable from vital sign standpoint.  His pain is been well controlled.  He will require hospitalization for further management of his injuries.  CRITICAL CARE Performed by: Orpah Greek   Total critical care time: 30 minutes  Critical care time was exclusive of separately billable procedures and treating other patients.  Critical care was necessary to treat or prevent imminent or life-threatening deterioration.  Critical care was time spent personally by me on the following activities: development  of treatment plan with patient and/or surrogate as well as nursing, discussions with consultants, evaluation of patient's response to treatment, examination of patient, obtaining history from patient or surrogate, ordering and performing treatments and interventions, ordering and review of laboratory studies, ordering and review of radiographic studies, pulse oximetry and re-evaluation of patient's condition.   Final Clinical Impressions(s) / ED Diagnoses   Final diagnoses:  Closed fracture of multiple ribs of right side, initial encounter  Hip dislocation, right, initial encounter ([place])  Closed displaced fracture of right acetabulum, unspecified portion of acetabulum, initial encounter ([place])  Closed fracture of body of sternum, initial encounter  Retroperitoneal hematoma    ED Discharge Orders    None       Orpah Greek, MD 06/30/17 [phone]

## 2017-06-30 ENCOUNTER — Emergency Department (HOSPITAL_COMMUNITY): Payer: Medicare HMO

## 2017-06-30 ENCOUNTER — Encounter (HOSPITAL_COMMUNITY): Payer: Self-pay | Admitting: Emergency Medicine

## 2017-06-30 DIAGNOSIS — K661 Hemoperitoneum: Secondary | ICD-10-CM | POA: Diagnosis present

## 2017-06-30 DIAGNOSIS — S2220XD Unspecified fracture of sternum, subsequent encounter for fracture with routine healing: Secondary | ICD-10-CM | POA: Diagnosis not present

## 2017-06-30 DIAGNOSIS — S32461A Displaced associated transverse-posterior fracture of right acetabulum, initial encounter for closed fracture: Secondary | ICD-10-CM | POA: Diagnosis present

## 2017-06-30 DIAGNOSIS — S2241XA Multiple fractures of ribs, right side, initial encounter for closed fracture: Secondary | ICD-10-CM | POA: Diagnosis present

## 2017-06-30 DIAGNOSIS — R402362 Coma scale, best motor response, obeys commands, at arrival to emergency department: Secondary | ICD-10-CM | POA: Diagnosis present

## 2017-06-30 DIAGNOSIS — Y92410 Unspecified street and highway as the place of occurrence of the external cause: Secondary | ICD-10-CM | POA: Diagnosis not present

## 2017-06-30 DIAGNOSIS — Z8546 Personal history of malignant neoplasm of prostate: Secondary | ICD-10-CM | POA: Diagnosis not present

## 2017-06-30 DIAGNOSIS — Z809 Family history of malignant neoplasm, unspecified: Secondary | ICD-10-CM | POA: Diagnosis not present

## 2017-06-30 DIAGNOSIS — Z7141 Alcohol abuse counseling and surveillance of alcoholic: Secondary | ICD-10-CM | POA: Diagnosis not present

## 2017-06-30 DIAGNOSIS — S2222XA Fracture of body of sternum, initial encounter for closed fracture: Secondary | ICD-10-CM | POA: Diagnosis present

## 2017-06-30 DIAGNOSIS — D72829 Elevated white blood cell count, unspecified: Secondary | ICD-10-CM | POA: Diagnosis not present

## 2017-06-30 DIAGNOSIS — I10 Essential (primary) hypertension: Secondary | ICD-10-CM | POA: Diagnosis present

## 2017-06-30 DIAGNOSIS — M7989 Other specified soft tissue disorders: Secondary | ICD-10-CM | POA: Diagnosis not present

## 2017-06-30 DIAGNOSIS — M898X9 Other specified disorders of bone, unspecified site: Secondary | ICD-10-CM | POA: Diagnosis present

## 2017-06-30 DIAGNOSIS — T148XXA Other injury of unspecified body region, initial encounter: Secondary | ICD-10-CM | POA: Diagnosis not present

## 2017-06-30 DIAGNOSIS — F1010 Alcohol abuse, uncomplicated: Secondary | ICD-10-CM | POA: Diagnosis present

## 2017-06-30 DIAGNOSIS — D62 Acute posthemorrhagic anemia: Secondary | ICD-10-CM | POA: Diagnosis present

## 2017-06-30 DIAGNOSIS — S32401D Unspecified fracture of right acetabulum, subsequent encounter for fracture with routine healing: Secondary | ICD-10-CM | POA: Diagnosis present

## 2017-06-30 DIAGNOSIS — R402252 Coma scale, best verbal response, oriented, at arrival to emergency department: Secondary | ICD-10-CM | POA: Diagnosis present

## 2017-06-30 DIAGNOSIS — Z79899 Other long term (current) drug therapy: Secondary | ICD-10-CM | POA: Diagnosis not present

## 2017-06-30 DIAGNOSIS — R3915 Urgency of urination: Secondary | ICD-10-CM | POA: Diagnosis present

## 2017-06-30 DIAGNOSIS — Z8042 Family history of malignant neoplasm of prostate: Secondary | ICD-10-CM | POA: Diagnosis not present

## 2017-06-30 DIAGNOSIS — E8809 Other disorders of plasma-protein metabolism, not elsewhere classified: Secondary | ICD-10-CM | POA: Diagnosis present

## 2017-06-30 DIAGNOSIS — S32441S Displaced fracture of posterior column [ilioischial] of right acetabulum, sequela: Secondary | ICD-10-CM | POA: Diagnosis not present

## 2017-06-30 DIAGNOSIS — S32441D Displaced fracture of posterior column [ilioischial] of right acetabulum, subsequent encounter for fracture with routine healing: Secondary | ICD-10-CM | POA: Diagnosis not present

## 2017-06-30 DIAGNOSIS — N179 Acute kidney failure, unspecified: Secondary | ICD-10-CM | POA: Diagnosis not present

## 2017-06-30 DIAGNOSIS — Z923 Personal history of irradiation: Secondary | ICD-10-CM | POA: Diagnosis not present

## 2017-06-30 DIAGNOSIS — R269 Unspecified abnormalities of gait and mobility: Secondary | ICD-10-CM | POA: Diagnosis not present

## 2017-06-30 DIAGNOSIS — E785 Hyperlipidemia, unspecified: Secondary | ICD-10-CM | POA: Diagnosis present

## 2017-06-30 DIAGNOSIS — S32401A Unspecified fracture of right acetabulum, initial encounter for closed fracture: Secondary | ICD-10-CM | POA: Diagnosis present

## 2017-06-30 DIAGNOSIS — G479 Sleep disorder, unspecified: Secondary | ICD-10-CM | POA: Diagnosis not present

## 2017-06-30 DIAGNOSIS — M879 Osteonecrosis, unspecified: Secondary | ICD-10-CM | POA: Diagnosis present

## 2017-06-30 DIAGNOSIS — S73014A Posterior dislocation of right hip, initial encounter: Secondary | ICD-10-CM | POA: Diagnosis present

## 2017-06-30 DIAGNOSIS — G8918 Other acute postprocedural pain: Secondary | ICD-10-CM | POA: Diagnosis not present

## 2017-06-30 DIAGNOSIS — Z823 Family history of stroke: Secondary | ICD-10-CM | POA: Diagnosis not present

## 2017-06-30 DIAGNOSIS — E559 Vitamin D deficiency, unspecified: Secondary | ICD-10-CM | POA: Diagnosis present

## 2017-06-30 DIAGNOSIS — C61 Malignant neoplasm of prostate: Secondary | ICD-10-CM | POA: Diagnosis not present

## 2017-06-30 DIAGNOSIS — S2241XD Multiple fractures of ribs, right side, subsequent encounter for fracture with routine healing: Secondary | ICD-10-CM | POA: Diagnosis not present

## 2017-06-30 DIAGNOSIS — S36892A Contusion of other intra-abdominal organs, initial encounter: Secondary | ICD-10-CM | POA: Diagnosis present

## 2017-06-30 DIAGNOSIS — R402142 Coma scale, eyes open, spontaneous, at arrival to emergency department: Secondary | ICD-10-CM | POA: Diagnosis present

## 2017-06-30 DIAGNOSIS — Z9981 Dependence on supplemental oxygen: Secondary | ICD-10-CM | POA: Diagnosis not present

## 2017-06-30 DIAGNOSIS — E46 Unspecified protein-calorie malnutrition: Secondary | ICD-10-CM | POA: Diagnosis not present

## 2017-06-30 DIAGNOSIS — Z8249 Family history of ischemic heart disease and other diseases of the circulatory system: Secondary | ICD-10-CM | POA: Diagnosis not present

## 2017-06-30 LAB — I-STAT CHEM 8, ED
BUN: 13 mg/dL (ref 6–20)
CHLORIDE: 109 mmol/L (ref 101–111)
Calcium, Ion: 1 mmol/L — ABNORMAL LOW (ref 1.15–1.40)
Creatinine, Ser: 1.2 mg/dL (ref 0.61–1.24)
Glucose, Bld: 119 mg/dL — ABNORMAL HIGH (ref 65–99)
HCT: 38 % — ABNORMAL LOW (ref 39.0–52.0)
Hemoglobin: 12.9 g/dL — ABNORMAL LOW (ref 13.0–17.0)
POTASSIUM: 4 mmol/L (ref 3.5–5.1)
SODIUM: 140 mmol/L (ref 135–145)
TCO2: 17 mmol/L — ABNORMAL LOW (ref 22–32)

## 2017-06-30 LAB — I-STAT CG4 LACTIC ACID, ED: LACTIC ACID, VENOUS: 5.33 mmol/L — AB (ref 0.5–1.9)

## 2017-06-30 LAB — COMPREHENSIVE METABOLIC PANEL
ALBUMIN: 4 g/dL (ref 3.5–5.0)
ALT: 24 U/L (ref 17–63)
AST: 42 U/L — AB (ref 15–41)
Alkaline Phosphatase: 39 U/L (ref 38–126)
Anion gap: 15 (ref 5–15)
BILIRUBIN TOTAL: 0.9 mg/dL (ref 0.3–1.2)
BUN: 11 mg/dL (ref 6–20)
CO2: 19 mmol/L — ABNORMAL LOW (ref 22–32)
Calcium: 8.9 mg/dL (ref 8.9–10.3)
Chloride: 105 mmol/L (ref 101–111)
Creatinine, Ser: 1.35 mg/dL — ABNORMAL HIGH (ref 0.61–1.24)
GFR calc Af Amer: 60 mL/min — ABNORMAL LOW (ref >60)
GFR calc non Af Amer: 52 mL/min — ABNORMAL LOW (ref >60)
GLUCOSE: 154 mg/dL — AB (ref 65–99)
Potassium: 3.7 mmol/L (ref 3.5–5.1)
Sodium: 139 mmol/L (ref 135–145)
TOTAL PROTEIN: 7 g/dL (ref 6.5–8.1)

## 2017-06-30 LAB — URINALYSIS, ROUTINE W REFLEX MICROSCOPIC
Bilirubin Urine: NEGATIVE
GLUCOSE, UA: NEGATIVE mg/dL
Ketones, ur: 5 mg/dL — AB
Leukocytes, UA: NEGATIVE
Nitrite: NEGATIVE
Protein, ur: NEGATIVE mg/dL
SPECIFIC GRAVITY, URINE: 1.031 — AB (ref 1.005–1.030)
pH: 5 (ref 5.0–8.0)

## 2017-06-30 LAB — CBC
HCT: 43.6 % (ref 39.0–52.0)
Hemoglobin: 14.2 g/dL (ref 13.0–17.0)
MCH: 27.6 pg (ref 26.0–34.0)
MCHC: 32.6 g/dL (ref 30.0–36.0)
MCV: 84.7 fL (ref 78.0–100.0)
Platelets: 286 10*3/uL (ref 150–400)
RBC: 5.15 MIL/uL (ref 4.22–5.81)
RDW: 13.7 % (ref 11.5–15.5)
WBC: 19.3 10*3/uL — ABNORMAL HIGH (ref 4.0–10.5)

## 2017-06-30 LAB — MRSA PCR SCREENING: MRSA by PCR: NEGATIVE

## 2017-06-30 LAB — ETHANOL: ALCOHOL ETHYL (B): 106 mg/dL — AB (ref <10)

## 2017-06-30 LAB — CDS SEROLOGY

## 2017-06-30 LAB — PROTIME-INR
INR: 1.02
PROTHROMBIN TIME: 13.3 s (ref 11.4–15.2)

## 2017-06-30 LAB — SAMPLE TO BLOOD BANK

## 2017-06-30 MED ORDER — HYDROMORPHONE HCL 1 MG/ML IJ SOLN
0.5000 mg | INTRAMUSCULAR | Status: DC | PRN
Start: 2017-06-30 — End: 2017-07-01
  Administered 2017-07-01: 1 mg via INTRAVENOUS
  Filled 2017-06-30: qty 1

## 2017-06-30 MED ORDER — FENTANYL CITRATE (PF) 100 MCG/2ML IJ SOLN
50.0000 ug | Freq: Once | INTRAMUSCULAR | Status: AC
  Administered 2017-06-30: 50 ug via INTRAVENOUS
  Filled 2017-06-30: qty 2

## 2017-06-30 MED ORDER — LOSARTAN POTASSIUM 50 MG PO TABS
100.0000 mg | ORAL_TABLET | Freq: Every day | ORAL | Status: DC
  Administered 2017-06-30: 100 mg via ORAL
  Filled 2017-06-30 (×3): qty 2

## 2017-06-30 MED ORDER — ONDANSETRON HCL 4 MG/2ML IJ SOLN
4.0000 mg | Freq: Once | INTRAMUSCULAR | Status: AC
  Administered 2017-06-30: 4 mg via INTRAVENOUS
  Filled 2017-06-30: qty 2

## 2017-06-30 MED ORDER — OXYCODONE HCL 5 MG PO TABS: 10.0000 mg | ORAL_TABLET | ORAL | Status: DC | PRN

## 2017-06-30 MED ORDER — HYDRALAZINE HCL 20 MG/ML IJ SOLN: 10.0000 mg | INTRAMUSCULAR | Status: DC | PRN

## 2017-06-30 MED ORDER — PROPOFOL 10 MG/ML IV BOLUS
INTRAVENOUS | Status: AC
  Administered 2017-06-30: 45 mg
  Filled 2017-06-30: qty 20

## 2017-06-30 MED ORDER — PROPOFOL 10 MG/ML IV BOLUS: 0.5000 mg/kg | Freq: Once | INTRAVENOUS | Status: DC

## 2017-06-30 MED ORDER — ONDANSETRON HCL 4 MG/2ML IJ SOLN
4.0000 mg | Freq: Four times a day (QID) | INTRAMUSCULAR | Status: DC | PRN
  Filled 2017-06-30: qty 2

## 2017-06-30 MED ORDER — AMLODIPINE BESYLATE 10 MG PO TABS
10.0000 mg | ORAL_TABLET | Freq: Every day | ORAL | Status: DC
  Administered 2017-06-30 – 2017-07-05 (×5): 10 mg via ORAL
  Filled 2017-06-30 (×2): qty 1
  Filled 2017-06-30: qty 2
  Filled 2017-06-30 (×2): qty 1

## 2017-06-30 MED ORDER — HYDROCHLOROTHIAZIDE 25 MG PO TABS
25.0000 mg | ORAL_TABLET | Freq: Every day | ORAL | Status: DC
  Administered 2017-06-30 – 2017-07-02 (×2): 25 mg via ORAL
  Filled 2017-06-30 (×2): qty 1

## 2017-06-30 MED ORDER — HYDROMORPHONE HCL 2 MG/ML IJ SOLN: 0.5000 mg | INTRAMUSCULAR | Status: DC | PRN

## 2017-06-30 MED ORDER — HYDROMORPHONE HCL 2 MG/ML IJ SOLN
1.0000 mg | Freq: Once | INTRAMUSCULAR | Status: AC
Start: 2017-06-30 — End: 2017-06-30
  Administered 2017-06-30: 1 mg via INTRAVENOUS
  Filled 2017-06-30: qty 1

## 2017-06-30 MED ORDER — ACETAMINOPHEN 325 MG PO TABS
650.0000 mg | ORAL_TABLET | Freq: Four times a day (QID) | ORAL | Status: DC
  Administered 2017-06-30 – 2017-07-01 (×3): 650 mg via ORAL
  Filled 2017-06-30 (×4): qty 2

## 2017-06-30 MED ORDER — HYDROMORPHONE HCL 2 MG/ML IJ SOLN
0.5000 mg | Freq: Once | INTRAMUSCULAR | Status: AC
  Administered 2017-06-30: 0.5 mg via INTRAVENOUS
  Filled 2017-06-30: qty 1

## 2017-06-30 MED ORDER — HYDROMORPHONE HCL 1 MG/ML IJ SOLN: 0.5000 mg | INTRAMUSCULAR | Status: DC | PRN

## 2017-06-30 MED ORDER — OXYCODONE HCL 5 MG PO TABS: 5.0000 mg | ORAL_TABLET | ORAL | Status: DC | PRN

## 2017-06-30 MED ORDER — IOHEXOL 300 MG/ML  SOLN
100.0000 mL | Freq: Once | INTRAMUSCULAR | Status: AC | PRN
  Administered 2017-06-30: 100 mL via INTRAVENOUS

## 2017-06-30 MED ORDER — ONDANSETRON 4 MG PO TBDP: 4.0000 mg | ORAL_TABLET | Freq: Four times a day (QID) | ORAL | Status: DC | PRN

## 2017-06-30 MED ORDER — METHOCARBAMOL 1000 MG/10ML IJ SOLN
1000.0000 mg | Freq: Three times a day (TID) | INTRAVENOUS | Status: DC | PRN
  Filled 2017-06-30: qty 10

## 2017-06-30 MED ORDER — ACETAMINOPHEN 325 MG PO TABS: 650.0000 mg | ORAL_TABLET | ORAL | Status: DC | PRN

## 2017-06-30 MED ORDER — KCL IN DEXTROSE-NACL 20-5-0.45 MEQ/L-%-% IV SOLN
INTRAVENOUS | Status: DC
  Administered 2017-06-30 – 2017-07-01 (×2): via INTRAVENOUS
  Filled 2017-06-30 (×3): qty 1000

## 2017-06-30 MED ORDER — LOSARTAN POTASSIUM-HCTZ 100-25 MG PO TABS: 1.0000 | ORAL_TABLET | Freq: Every day | ORAL | Status: DC
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
## 2017-06-30 NOTE — ED Notes (Signed)
Orthopedic surgeon at bedside. 
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
## 2017-06-30 NOTE — ED Notes (Addendum)
Darrell Harrison (daughter) wishes to be notified when approximate surgery time is established  [phone]
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
## 2017-06-30 NOTE — ED Notes (Signed)
DAYLON LAFAVOR- [phone] (PATIENT'S DAUGHTER)

## 2017-06-30 NOTE — ED Notes (Signed)
  Patient was given 45mg  of propofol 3 times during moderate sedation for R hip dislocation.  Admin times were 0204, 0208, 0212.  Doses were verified by Beckie Busing RN.  Remaining amount of propofol was wasted in sharps container with Chief Strategy Officer
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
## 2017-06-30 NOTE — ED Notes (Signed)
Patient transported to CT 

## 2017-07-01 ENCOUNTER — Inpatient Hospital Stay (HOSPITAL_COMMUNITY): Payer: Medicare HMO | Admitting: Anesthesiology

## 2017-07-01 ENCOUNTER — Inpatient Hospital Stay (HOSPITAL_COMMUNITY): Payer: Medicare HMO

## 2017-07-01 ENCOUNTER — Encounter (HOSPITAL_COMMUNITY): Admission: EM | Disposition: A | Discharge status: Rehabilitation Facility | Payer: Self-pay | Source: Home / Self Care

## 2017-07-01 ENCOUNTER — Encounter (HOSPITAL_COMMUNITY): Payer: Self-pay | Admitting: Orthopedic Surgery

## 2017-07-01 ENCOUNTER — Other Ambulatory Visit: Payer: Self-pay

## 2017-07-01 HISTORY — PX: ORIF ACETABULAR FRACTURE: SHX5029

## 2017-07-01 LAB — PROTIME-INR
INR: 1.1
Prothrombin Time: 14.1 seconds (ref 11.4–15.2)

## 2017-07-01 LAB — CBC
HEMATOCRIT: 41.5 % (ref 39.0–52.0)
Hemoglobin: 13.8 g/dL (ref 13.0–17.0)
MCH: 27.7 pg (ref 26.0–34.0)
MCHC: 33.3 g/dL (ref 30.0–36.0)
MCV: 83.2 fL (ref 78.0–100.0)
Platelets: 226 10*3/uL (ref 150–400)
RBC: 4.99 MIL/uL (ref 4.22–5.81)
RDW: 14.1 % (ref 11.5–15.5)
WBC: 12.9 10*3/uL — AB (ref 4.0–10.5)

## 2017-07-01 LAB — BASIC METABOLIC PANEL
Anion gap: 5 (ref 5–15)
BUN: 10 mg/dL (ref 6–20)
CO2: 25 mmol/L (ref 22–32)
Calcium: 8.6 mg/dL — ABNORMAL LOW (ref 8.9–10.3)
Chloride: 105 mmol/L (ref 101–111)
Creatinine, Ser: 1.15 mg/dL (ref 0.61–1.24)
GFR calc Af Amer: >60 mL/min (ref >60)
GFR calc non Af Amer: >60 mL/min (ref >60)
GLUCOSE: 142 mg/dL — AB (ref 65–99)
POTASSIUM: 4.1 mmol/L (ref 3.5–5.1)
Sodium: 135 mmol/L (ref 135–145)

## 2017-07-01 LAB — TYPE AND SCREEN
ABO/RH(D): A POS
ANTIBODY SCREEN: NEGATIVE

## 2017-07-01 LAB — ABO/RH: ABO/RH(D): A POS

## 2017-07-01 LAB — LACTIC ACID, PLASMA: LACTIC ACID, VENOUS: 1 mmol/L (ref 0.5–1.9)

## 2017-07-01 LAB — HIV ANTIBODY (ROUTINE TESTING W REFLEX): HIV SCREEN 4TH GENERATION: NONREACTIVE

## 2017-07-01 SURGERY — OPEN REDUCTION INTERNAL FIXATION (ORIF) ACETABULAR FRACTURE
Anesthesia: General | Site: Hip | Laterality: Right

## 2017-07-01 MED ORDER — ONDANSETRON HCL 4 MG/2ML IJ SOLN
INTRAMUSCULAR | Status: DC | PRN
  Administered 2017-07-01: 4 mg via INTRAVENOUS

## 2017-07-01 MED ORDER — SUGAMMADEX SODIUM 500 MG/5ML IV SOLN
INTRAVENOUS | Status: DC | PRN
  Administered 2017-07-01: 350 mg via INTRAVENOUS

## 2017-07-01 MED ORDER — ONDANSETRON HCL 4 MG/2ML IJ SOLN
4.0000 mg | Freq: Four times a day (QID) | INTRAMUSCULAR | Status: DC | PRN
Start: 2017-07-01 — End: 2017-07-05
  Administered 2017-07-01: 4 mg via INTRAVENOUS
  Filled 2017-07-01: qty 2

## 2017-07-01 MED ORDER — ONDANSETRON HCL 4 MG PO TABS: 4.0000 mg | ORAL_TABLET | Freq: Four times a day (QID) | ORAL | Status: DC | PRN

## 2017-07-01 MED ORDER — FENTANYL CITRATE (PF) 250 MCG/5ML IJ SOLN
INTRAMUSCULAR | Status: AC
  Filled 2017-07-01: qty 5

## 2017-07-01 MED ORDER — PROPOFOL 10 MG/ML IV BOLUS
INTRAVENOUS | Status: AC
Start: 2017-07-01 — End: ?
  Filled 2017-07-01: qty 20

## 2017-07-01 MED ORDER — CEFAZOLIN SODIUM-DEXTROSE 2-4 GM/100ML-% IV SOLN
INTRAVENOUS | Status: AC
  Filled 2017-07-01: qty 100

## 2017-07-01 MED ORDER — METOCLOPRAMIDE HCL 5 MG PO TABS
5.0000 mg | ORAL_TABLET | Freq: Three times a day (TID) | ORAL | Status: DC | PRN
  Filled 2017-07-01: qty 2

## 2017-07-01 MED ORDER — POLYETHYLENE GLYCOL 3350 17 G PO PACK
17.0000 g | PACK | Freq: Every day | ORAL | Status: DC
  Administered 2017-07-02 – 2017-07-03 (×2): 17 g via ORAL
  Filled 2017-07-01 (×4): qty 1

## 2017-07-01 MED ORDER — LACTATED RINGERS IV SOLN
INTRAVENOUS | Status: DC
  Administered 2017-07-01: 10:00:00 via INTRAVENOUS

## 2017-07-01 MED ORDER — ONDANSETRON HCL 4 MG/5ML PO SOLN
4.0000 mg | Freq: Once | ORAL | Status: AC
  Administered 2017-07-01: 4 mg via ORAL
  Filled 2017-07-01: qty 5

## 2017-07-01 MED ORDER — MEPERIDINE HCL 50 MG/ML IJ SOLN: 6.2500 mg | INTRAMUSCULAR | Status: DC | PRN

## 2017-07-01 MED ORDER — METHOCARBAMOL 500 MG PO TABS
750.0000 mg | ORAL_TABLET | Freq: Four times a day (QID) | ORAL | Status: DC
  Administered 2017-07-02 – 2017-07-05 (×10): 750 mg via ORAL
  Filled 2017-07-01: qty 2
  Filled 2017-07-01 (×8): qty 1
  Filled 2017-07-01: qty 2
  Filled 2017-07-01 (×9): qty 1

## 2017-07-01 MED ORDER — ENOXAPARIN SODIUM 40 MG/0.4ML ~~LOC~~ SOLN
40.0000 mg | SUBCUTANEOUS | Status: DC
  Administered 2017-07-02 – 2017-07-05 (×4): 40 mg via SUBCUTANEOUS
  Filled 2017-07-01 (×4): qty 0.4

## 2017-07-01 MED ORDER — EPHEDRINE SULFATE 50 MG/ML IJ SOLN
INTRAMUSCULAR | Status: DC | PRN
  Administered 2017-07-01: 10 mg via INTRAVENOUS

## 2017-07-01 MED ORDER — PROMETHAZINE HCL 25 MG/ML IJ SOLN: 6.2500 mg | INTRAMUSCULAR | Status: DC | PRN

## 2017-07-01 MED ORDER — LACTATED RINGERS IV SOLN
INTRAVENOUS | Status: DC | PRN
  Administered 2017-07-01 (×2): via INTRAVENOUS

## 2017-07-01 MED ORDER — PHENYLEPHRINE HCL 10 MG/ML IJ SOLN
INTRAMUSCULAR | Status: DC | PRN
  Administered 2017-07-01: 80 ug via INTRAVENOUS

## 2017-07-01 MED ORDER — CEFAZOLIN SODIUM-DEXTROSE 1-4 GM/50ML-% IV SOLN
1.0000 g | Freq: Four times a day (QID) | INTRAVENOUS | Status: AC
  Administered 2017-07-01 – 2017-07-02 (×3): 1 g via INTRAVENOUS
  Filled 2017-07-01 (×3): qty 50

## 2017-07-01 MED ORDER — HYDROMORPHONE HCL 1 MG/ML IJ SOLN: 0.5000 mg | INTRAMUSCULAR | Status: DC | PRN

## 2017-07-01 MED ORDER — PHENYLEPHRINE HCL 10 MG/ML IJ SOLN
INTRAVENOUS | Status: DC | PRN
  Administered 2017-07-01: 50 ug/min via INTRAVENOUS

## 2017-07-01 MED ORDER — SODIUM CHLORIDE 0.9 % IR SOLN
Status: DC | PRN
  Administered 2017-07-01: 3000 mL

## 2017-07-01 MED ORDER — CEFAZOLIN SODIUM-DEXTROSE 2-4 GM/100ML-% IV SOLN
2.0000 g | Freq: Once | INTRAVENOUS | Status: AC
  Administered 2017-07-01: 2 g via INTRAVENOUS

## 2017-07-01 MED ORDER — HYDROMORPHONE HCL 1 MG/ML IJ SOLN
INTRAMUSCULAR | Status: AC
  Administered 2017-07-01: 1 mg
  Filled 2017-07-01: qty 1

## 2017-07-01 MED ORDER — HYDROMORPHONE HCL 2 MG/ML IJ SOLN: 0.3000 mg | INTRAMUSCULAR | Status: DC | PRN

## 2017-07-01 MED ORDER — METOCLOPRAMIDE HCL 5 MG/ML IJ SOLN: 5.0000 mg | Freq: Three times a day (TID) | INTRAMUSCULAR | Status: DC | PRN

## 2017-07-01 MED ORDER — LIDOCAINE HCL (CARDIAC) PF 100 MG/5ML IV SOSY
PREFILLED_SYRINGE | INTRAVENOUS | Status: DC | PRN
  Administered 2017-07-01: 80 mg via INTRAVENOUS

## 2017-07-01 MED ORDER — DEXTROSE 5 % IV SOLN
500.0000 mg | Freq: Four times a day (QID) | INTRAVENOUS | Status: DC
  Administered 2017-07-01 (×2): 500 mg via INTRAVENOUS
  Filled 2017-07-01 (×20): qty 5

## 2017-07-01 MED ORDER — FENTANYL CITRATE (PF) 100 MCG/2ML IJ SOLN
INTRAMUSCULAR | Status: DC | PRN
  Administered 2017-07-01: 50 ug via INTRAVENOUS
  Administered 2017-07-01: 150 ug via INTRAVENOUS
  Administered 2017-07-01 (×3): 50 ug via INTRAVENOUS

## 2017-07-01 MED ORDER — DOCUSATE SODIUM 100 MG PO CAPS
100.0000 mg | ORAL_CAPSULE | Freq: Two times a day (BID) | ORAL | Status: DC
  Administered 2017-07-02 – 2017-07-04 (×4): 100 mg via ORAL
  Filled 2017-07-01 (×5): qty 1

## 2017-07-01 MED ORDER — ROCURONIUM BROMIDE 100 MG/10ML IV SOLN
INTRAVENOUS | Status: DC | PRN
  Administered 2017-07-01: 10 mg via INTRAVENOUS
  Administered 2017-07-01: 50 mg via INTRAVENOUS
  Administered 2017-07-01 (×2): 10 mg via INTRAVENOUS

## 2017-07-01 MED ORDER — PROPOFOL 10 MG/ML IV BOLUS
INTRAVENOUS | Status: DC | PRN
  Administered 2017-07-01: 150 mg via INTRAVENOUS

## 2017-07-01 MED ORDER — BISACODYL 5 MG PO TBEC: 5.0000 mg | DELAYED_RELEASE_TABLET | Freq: Every day | ORAL | Status: DC | PRN

## 2017-07-01 MED ORDER — OXYCODONE HCL 5 MG PO TABS: 15.0000 mg | ORAL_TABLET | ORAL | Status: DC | PRN

## 2017-07-01 MED ORDER — 0.9 % SODIUM CHLORIDE (POUR BTL) OPTIME
TOPICAL | Status: DC | PRN
  Administered 2017-07-01: 1000 mL

## 2017-07-01 MED ORDER — OXYCODONE HCL 5 MG PO TABS
10.0000 mg | ORAL_TABLET | ORAL | Status: DC | PRN
  Administered 2017-07-02 – 2017-07-05 (×3): 10 mg via ORAL
  Filled 2017-07-01 (×4): qty 2

## 2017-07-01 SURGICAL SUPPLY — 81 items
BIT DRILL AO MATTA 2.5MX230M (BIT) ×2 IMPLANT
BIT DRILL STEP 3.5 (DRILL) IMPLANT
BLADE CLIPPER SURG (BLADE) IMPLANT
BNDG COHESIVE 6X5 TAN STRL LF (GAUZE/BANDAGES/DRESSINGS) ×3 IMPLANT
BRUSH SCRUB SURG 4.25 DISP (MISCELLANEOUS) ×6 IMPLANT
CLOSURE WOUND 1/2 X4 (GAUZE/BANDAGES/DRESSINGS) ×1
COVER SURGICAL LIGHT HANDLE (MISCELLANEOUS) ×3 IMPLANT
DRAPE C-ARM 42X72 X-RAY (DRAPES) ×3 IMPLANT
DRAPE C-ARMOR (DRAPES) ×3 IMPLANT
DRAPE INCISE IOBAN 66X45 STRL (DRAPES) ×3 IMPLANT
DRAPE INCISE IOBAN 85X60 (DRAPES) ×3 IMPLANT
DRAPE ORTHO SPLIT 77X108 STRL (DRAPES) ×4
DRAPE SURG ORHT 6 SPLT 77X108 (DRAPES) ×2 IMPLANT
DRAPE U-SHAPE 47X51 STRL (DRAPES) ×3 IMPLANT
DRILL BIT AO MATTA 2.5MX230M (BIT) ×6
DRILL STEP 3.5 (DRILL)
DRSG MEPILEX BORDER 4X12 (GAUZE/BANDAGES/DRESSINGS) IMPLANT
DRSG MEPILEX BORDER 4X4 (GAUZE/BANDAGES/DRESSINGS) ×3 IMPLANT
DRSG MEPILEX BORDER 4X8 (GAUZE/BANDAGES/DRESSINGS) ×3 IMPLANT
ELECT BLADE 6.5 EXT (BLADE) ×3 IMPLANT
ELECT CAUTERY BLADE 6.4 (BLADE) ×3 IMPLANT
ELECT REM PT RETURN 9FT ADLT (ELECTROSURGICAL) ×3
ELECTRODE REM PT RTRN 9FT ADLT (ELECTROSURGICAL) ×1 IMPLANT
GLOVE BIO SURGEON STRL SZ7.5 (GLOVE) ×3 IMPLANT
GLOVE BIO SURGEON STRL SZ8 (GLOVE) ×3 IMPLANT
GLOVE BIO SURGEON STRL SZ8.5 (GLOVE) ×3 IMPLANT
GLOVE BIOGEL PI IND STRL 6.5 (GLOVE) ×3 IMPLANT
GLOVE BIOGEL PI IND STRL 7.5 (GLOVE) ×1 IMPLANT
GLOVE BIOGEL PI IND STRL 8 (GLOVE) ×4 IMPLANT
GLOVE BIOGEL PI INDICATOR 6.5 (GLOVE) ×6
GLOVE BIOGEL PI INDICATOR 7.5 (GLOVE) ×2
GLOVE BIOGEL PI INDICATOR 8 (GLOVE) ×8
GLOVE SURG SS PI 6.5 STRL IVOR (GLOVE) ×9 IMPLANT
GOWN STRL REUS W/ TWL LRG LVL3 (GOWN DISPOSABLE) ×3 IMPLANT
GOWN STRL REUS W/ TWL XL LVL3 (GOWN DISPOSABLE) ×2 IMPLANT
GOWN STRL REUS W/TWL LRG LVL3 (GOWN DISPOSABLE) ×6
GOWN STRL REUS W/TWL XL LVL3 (GOWN DISPOSABLE) ×4
HANDPIECE INTERPULSE COAX TIP (DISPOSABLE) ×2
KIT BASIN OR (CUSTOM PROCEDURE TRAY) ×3 IMPLANT
KIT TURNOVER KIT B (KITS) ×3 IMPLANT
MANIFOLD NEPTUNE II (INSTRUMENTS) ×3 IMPLANT
MARKER SKIN DUAL TIP RULER LAB (MISCELLANEOUS) ×3 IMPLANT
NS IRRIG 1000ML POUR BTL (IV SOLUTION) ×3 IMPLANT
PACK TOTAL JOINT (CUSTOM PROCEDURE TRAY) ×3 IMPLANT
PAD ARMBOARD 7.5X6 YLW CONV (MISCELLANEOUS) ×6 IMPLANT
PIN APEX 6X180MM EXFIX (EXFIX) ×3 IMPLANT
PLATE ACET STRT 118.5M 10H (Plate) ×3 IMPLANT
PLATE ACET STRT 94.5M 8H (Plate) ×3 IMPLANT
RETRACTOR YANK SUCT EIGR SABER (INSTRUMENTS) ×3 IMPLANT
RETRIEVER SUT HEWSON (MISCELLANEOUS) ×3 IMPLANT
SCREW CORTEX ST MATTA 3.5X20 (Screw) ×3 IMPLANT
SCREW CORTEX ST MATTA 3.5X26MM (Screw) ×3 IMPLANT
SCREW CORTEX ST MATTA 3.5X28MM (Screw) ×3 IMPLANT
SCREW CORTEX ST MATTA 3.5X30MM (Screw) ×3 IMPLANT
SCREW CORTEX ST MATTA 3.5X32MM (Screw) ×6 IMPLANT
SCREW CORTEX ST MATTA 3.5X34MM (Screw) ×6 IMPLANT
SCREW CORTEX ST MATTA 3.5X36MM (Screw) ×3 IMPLANT
SCREW CORTEX ST MATTA 3.5X40MM (Screw) ×3 IMPLANT
SCRUB BETADINE 4OZ XXX (MISCELLANEOUS) ×3 IMPLANT
SET HNDPC FAN SPRY TIP SCT (DISPOSABLE) ×1 IMPLANT
SOLUTION BETADINE 4OZ (MISCELLANEOUS) ×3 IMPLANT
SPONGE LAP 18X18 X RAY DECT (DISPOSABLE) IMPLANT
STAPLER VISISTAT 35W (STAPLE) ×3 IMPLANT
STRIP CLOSURE SKIN 1/2X4 (GAUZE/BANDAGES/DRESSINGS) ×2 IMPLANT
SUCTION FRAZIER HANDLE 10FR (MISCELLANEOUS) ×2
SUCTION TUBE FRAZIER 10FR DISP (MISCELLANEOUS) ×1 IMPLANT
SUT ETHILON 2 0 PSLX (SUTURE) ×3 IMPLANT
SUT FIBERWIRE #2 38 T-5 BLUE (SUTURE) ×6
SUT VIC AB 0 CT1 27 (SUTURE) ×2
SUT VIC AB 0 CT1 27XBRD ANBCTR (SUTURE) ×1 IMPLANT
SUT VIC AB 1 CT1 18XCR BRD 8 (SUTURE) ×1 IMPLANT
SUT VIC AB 1 CT1 27 (SUTURE)
SUT VIC AB 1 CT1 27XBRD ANBCTR (SUTURE) IMPLANT
SUT VIC AB 1 CT1 8-18 (SUTURE) ×2
SUT VIC AB 2-0 CT1 27 (SUTURE) ×4
SUT VIC AB 2-0 CT1 TAPERPNT 27 (SUTURE) ×2 IMPLANT
SUTURE FIBERWR #2 38 T-5 BLUE (SUTURE) ×2 IMPLANT
TOWEL OR 17X24 6PK STRL BLUE (TOWEL DISPOSABLE) ×6 IMPLANT
TOWEL OR 17X26 10 PK STRL BLUE (TOWEL DISPOSABLE) ×6 IMPLANT
TRAY FOLEY MTR SLVR 16FR STAT (SET/KITS/TRAYS/PACK) ×3 IMPLANT
WATER STERILE IRR 1000ML POUR (IV SOLUTION) IMPLANT

## 2017-07-01 NOTE — Anesthesia Preprocedure Evaluation (Addendum)
Anesthesia Evaluation  Patient identified by MRN, date of birth, ID band Patient awake    Reviewed: Allergy & Precautions, NPO status , Patient's Chart, lab work & pertinent test results, reviewed documented beta blocker date and time   Airway Mallampati: II  TM Distance: >3 FB Neck ROM: Full    Dental  (+) Missing, Poor Dentition, Dental Advidsory Given,    Pulmonary neg pulmonary ROS,    Pulmonary exam normal breath sounds clear to auscultation       Cardiovascular hypertension, Pt. on medications Normal cardiovascular exam Rhythm:Regular Rate:Normal     Neuro/Psych negative neurological ROS  negative psych ROS   GI/Hepatic negative GI ROS, Neg liver ROS,   Endo/Other  negative endocrine ROS  Renal/GU negative Renal ROS     Musculoskeletal Right Acetabular Fx Fx ribs right x 6   Abdominal   Peds  Hematology negative hematology ROS (+)   Anesthesia Other Findings   Reproductive/Obstetrics                            Anesthesia Physical Anesthesia Plan  ASA: II  Anesthesia Plan: General   Post-op Pain Management:    Induction:   PONV Risk Score and Plan: 3 and Ondansetron, Dexamethasone and Treatment may vary due to age or medical condition  Airway Management Planned: Oral ETT  Additional Equipment:   Intra-op Plan:   Post-operative Plan: Extubation in OR  Informed Consent: I have reviewed the patients History and Physical, chart, labs and discussed the procedure including the risks, benefits and alternatives for the proposed anesthesia with the patient or authorized representative who has indicated his/her understanding and acceptance.   Dental advisory given and Dental Advisory Given  Plan Discussed with: CRNA and Surgeon  Anesthesia Plan Comments: (Ventilate with low Vt.)       Anesthesia Quick Evaluation

## 2017-07-01 NOTE — Anesthesia Postprocedure Evaluation (Signed)
Anesthesia Post Note  Patient: Darrell Harrison.  Procedure(s) Performed: OPEN REDUCTION INTERNAL FIXATION (ORIF) RIGHT ACETABULAR FRACTURE (Right Hip)     Patient location during evaluation: PACU Anesthesia Type: General Level of consciousness: awake and alert and oriented Pain management: pain level controlled Vital Signs Assessment: post-procedure vital signs reviewed and stable Respiratory status: spontaneous breathing, nonlabored ventilation, respiratory function stable and patient connected to nasal cannula oxygen Cardiovascular status: blood pressure returned to baseline and stable Postop Assessment: no apparent nausea or vomiting Anesthetic complications: no    Last Vitals:  Vitals:   07/01/17 0806 07/01/17 1444  BP: 121/86 107/74  Pulse: 69 77  Resp: (!) 24 12  Temp: 37.1 C 36.5 C  SpO2: 94% 97%    Last Pain:  Vitals:   07/01/17 1444  TempSrc:   PainSc: 0-No pain                 Jania Steinke A.

## 2017-07-01 NOTE — Transfer of Care (Signed)
Immediate Anesthesia Transfer of Care Note  Patient: Darrell Harrison.  Procedure(s) Performed: OPEN REDUCTION INTERNAL FIXATION (ORIF) RIGHT ACETABULAR FRACTURE (Right Hip)  Patient Location: PACU  Anesthesia Type:General  Level of Consciousness: awake  Airway & Oxygen Therapy: Patient Spontanous Breathing and Patient connected to face mask oxygen  Post-op Assessment: Report given to RN, Post -op Vital signs reviewed and stable and Patient moving all extremities X 4  Post vital signs: Reviewed and stable  Last Vitals:  Vitals Value Taken Time  BP 107/74 07/01/2017  2:44 PM  Temp    Pulse 71 07/01/2017  2:45 PM  Resp 12 07/01/2017  2:45 PM  SpO2 94 % 07/01/2017  2:45 PM  Vitals shown include unvalidated device data.  Last Pain:  Vitals:   07/01/17 0806  TempSrc: Oral  PainSc: 6       Patients Stated Pain Goal: 3 (39/03/00 9233)  Complications: No apparent anesthesia complications

## 2017-07-01 NOTE — Anesthesia Procedure Notes (Signed)
Procedure Name: Intubation Date/Time: 07/01/2017 11:10 AM Performed by: Neldon Newport, CRNA Pre-anesthesia Checklist: Timeout performed, Patient being monitored, Suction available, Emergency Drugs available and Patient identified Patient Re-evaluated:Patient Re-evaluated prior to induction Oxygen Delivery Method: Circle system utilized Preoxygenation: Pre-oxygenation with 100% oxygen Induction Type: IV induction Ventilation: Mask ventilation without difficulty Laryngoscope Size: Mac and 3 Grade View: Grade II Tube type: Oral (per Dr. Royce Macadamia) Tube size: 7.5 mm Number of attempts: 1 Placement Confirmation: breath sounds checked- equal and bilateral,  positive ETCO2 and ETT inserted through vocal cords under direct vision Secured at: 23 cm Tube secured with: Tape Dental Injury: Teeth and Oropharynx as per pre-operative assessment
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
## 2017-07-01 NOTE — Progress Notes (Signed)
2000: Contacted pt's daughter, Virgilio Belling, and advised of scheduled surgery time.   0530: Contacted Latonya again to notify of change in surgery time.
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
## 2017-07-02 ENCOUNTER — Encounter (HOSPITAL_COMMUNITY): Payer: Self-pay | Admitting: Orthopedic Surgery

## 2017-07-02 ENCOUNTER — Ambulatory Visit
Admit: 2017-07-02 | Discharge: 2017-07-02 | Disposition: A | Discharge status: Home / Self Care | Payer: Medicare HMO | Attending: Radiation Oncology | Admitting: Radiation Oncology

## 2017-07-02 ENCOUNTER — Encounter: Payer: Self-pay | Admitting: Radiation Oncology

## 2017-07-02 DIAGNOSIS — S32401A Unspecified fracture of right acetabulum, initial encounter for closed fracture: Secondary | ICD-10-CM

## 2017-07-02 LAB — BASIC METABOLIC PANEL
ANION GAP: 10 (ref 5–15)
BUN: 15 mg/dL (ref 6–20)
CALCIUM: 8.2 mg/dL — AB (ref 8.9–10.3)
CHLORIDE: 105 mmol/L (ref 101–111)
CO2: 20 mmol/L — AB (ref 22–32)
Creatinine, Ser: 1.25 mg/dL — ABNORMAL HIGH (ref 0.61–1.24)
GFR calc non Af Amer: 57 mL/min — ABNORMAL LOW (ref >60)
Glucose, Bld: 140 mg/dL — ABNORMAL HIGH (ref 65–99)
Potassium: 4.5 mmol/L (ref 3.5–5.1)
SODIUM: 135 mmol/L (ref 135–145)

## 2017-07-02 LAB — CBC
HCT: 35.6 % — ABNORMAL LOW (ref 39.0–52.0)
Hemoglobin: 11.6 g/dL — ABNORMAL LOW (ref 13.0–17.0)
MCH: 27.4 pg (ref 26.0–34.0)
MCHC: 32.6 g/dL (ref 30.0–36.0)
MCV: 84 fL (ref 78.0–100.0)
PLATELETS: 171 10*3/uL (ref 150–400)
RBC: 4.24 MIL/uL (ref 4.22–5.81)
RDW: 14.2 % (ref 11.5–15.5)
WBC: 16.9 10*3/uL — ABNORMAL HIGH (ref 4.0–10.5)

## 2017-07-02 MED ORDER — ACETAMINOPHEN 500 MG PO TABS
1000.0000 mg | ORAL_TABLET | Freq: Three times a day (TID) | ORAL | Status: DC
  Administered 2017-07-02 – 2017-07-05 (×9): 1000 mg via ORAL
  Filled 2017-07-02 (×10): qty 2

## 2017-07-02 MED ORDER — TRAMADOL HCL 50 MG PO TABS
50.0000 mg | ORAL_TABLET | Freq: Four times a day (QID) | ORAL | Status: DC
  Administered 2017-07-02 – 2017-07-05 (×10): 50 mg via ORAL
  Filled 2017-07-02 (×11): qty 1

## 2017-07-02 MED ORDER — POTASSIUM CHLORIDE 2 MEQ/ML IV SOLN
INTRAVENOUS | Status: DC
  Administered 2017-07-02 – 2017-07-04 (×5): via INTRAVENOUS
  Filled 2017-07-02 (×9): qty 1000

## 2017-07-02 MED ORDER — KETOROLAC TROMETHAMINE 15 MG/ML IJ SOLN: 15.0000 mg | Freq: Three times a day (TID) | INTRAMUSCULAR | Status: DC

## 2017-07-02 MED ORDER — LOSARTAN POTASSIUM 50 MG PO TABS
50.0000 mg | ORAL_TABLET | Freq: Every day | ORAL | Status: DC
  Administered 2017-07-02 – 2017-07-05 (×3): 50 mg via ORAL
  Filled 2017-07-02 (×3): qty 1
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
## 2017-07-02 NOTE — Progress Notes (Signed)
[facility]   Pt off the floor for XRT for HO prophylaxis following R acetabulum fracture dislocation and repair  Notes and labs reviewed  TDWB R leg x 8 weeks Posterior hip precautions x 12 weeks Recommend lovenox x 6 weeks  May need SNF as he lives alone, could consider CIR eval as well   Jari Pigg, PA-C [facility] [phone] (C) [phone] (O) 07/02/2017 1:34 PM
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
## 2017-07-02 NOTE — Progress Notes (Signed)
MD paged regarding patient hip dressing saturated. MD stated to reinforce dressing.
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
## 2017-07-02 NOTE — Progress Notes (Signed)
[facility] Therapy Treatment Record Phone [phone]   Radiation Therapy was administered to [place]. on: 07/02/2017  1:25 PM and was treatment #1out of a planned course of 1 treatments.  Radiation Treatment  1). Beam photons with 6-10 energy  2). Brachytherapy None  3). Stereotactic Radiosurgery None  4). Other Radiation None     Shareeka Yim, RT (T)
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
## 2017-07-02 NOTE — Progress Notes (Signed)
Spoke with Merchandiser, retail at [facility] regarding the patient having a radiation treatment scheduled for today.  I informed her that the patient needs to be here at 12:30 for a 1:00 pm treatment.  I let her know that she would need to set up transportation.  Will continue to follow as necessary.  Cori Razor, RN  [phone]

## 2017-07-03 ENCOUNTER — Encounter: Payer: Self-pay | Admitting: Radiation Oncology

## 2017-07-03 ENCOUNTER — Encounter (HOSPITAL_COMMUNITY): Payer: Self-pay | Admitting: Orthopedic Surgery

## 2017-07-03 DIAGNOSIS — F1010 Alcohol abuse, uncomplicated: Secondary | ICD-10-CM

## 2017-07-03 DIAGNOSIS — S73014A Posterior dislocation of right hip, initial encounter: Secondary | ICD-10-CM

## 2017-07-03 DIAGNOSIS — D62 Acute posthemorrhagic anemia: Secondary | ICD-10-CM

## 2017-07-03 DIAGNOSIS — Z9981 Dependence on supplemental oxygen: Secondary | ICD-10-CM

## 2017-07-03 DIAGNOSIS — I10 Essential (primary) hypertension: Secondary | ICD-10-CM

## 2017-07-03 DIAGNOSIS — C61 Malignant neoplasm of prostate: Secondary | ICD-10-CM

## 2017-07-03 DIAGNOSIS — D72829 Elevated white blood cell count, unspecified: Secondary | ICD-10-CM

## 2017-07-03 DIAGNOSIS — S2222XA Fracture of body of sternum, initial encounter for closed fracture: Secondary | ICD-10-CM

## 2017-07-03 HISTORY — DX: Posterior dislocation of right hip, initial encounter: S73.014A

## 2017-07-03 LAB — CBC
HEMATOCRIT: 33.2 % — AB (ref 39.0–52.0)
Hemoglobin: 10.5 g/dL — ABNORMAL LOW (ref 13.0–17.0)
MCH: 27.1 pg (ref 26.0–34.0)
MCHC: 31.6 g/dL (ref 30.0–36.0)
MCV: 85.8 fL (ref 78.0–100.0)
Platelets: 239 10*3/uL (ref 150–400)
RBC: 3.87 MIL/uL — ABNORMAL LOW (ref 4.22–5.81)
RDW: 14 % (ref 11.5–15.5)
WBC: 13.4 10*3/uL — AB (ref 4.0–10.5)

## 2017-07-03 LAB — BASIC METABOLIC PANEL
Anion gap: 5 (ref 5–15)
BUN: 19 mg/dL (ref 6–20)
CHLORIDE: 102 mmol/L (ref 101–111)
CO2: 29 mmol/L (ref 22–32)
Calcium: 8.3 mg/dL — ABNORMAL LOW (ref 8.9–10.3)
Creatinine, Ser: 1.25 mg/dL — ABNORMAL HIGH (ref 0.61–1.24)
GFR calc Af Amer: >60 mL/min (ref >60)
GFR calc non Af Amer: 57 mL/min — ABNORMAL LOW (ref >60)
GLUCOSE: 112 mg/dL — AB (ref 65–99)
POTASSIUM: 4.6 mmol/L (ref 3.5–5.1)
SODIUM: 136 mmol/L (ref 135–145)

## 2017-07-03 LAB — CALCITRIOL (1,25 DI-OH VIT D): Vit D, 1,25-Dihydroxy: 51.8 pg/mL (ref 19.9–79.3)

## 2017-07-03 LAB — VITAMIN D 25 HYDROXY (VIT D DEFICIENCY, FRACTURES): Vit D, 25-Hydroxy: 17.6 ng/mL — ABNORMAL LOW (ref 30.0–100.0)

## 2017-07-03 LAB — CALCIUM, IONIZED: Calcium, Ionized, Serum: 4.7 mg/dL (ref 4.5–5.6)

## 2017-07-03 MED ORDER — SODIUM CHLORIDE 0.9 % IV BOLUS
500.0000 mL | Freq: Once | INTRAVENOUS | Status: AC
  Administered 2017-07-03: 500 mL via INTRAVENOUS

## 2017-07-03 MED ORDER — VITAMIN C 500 MG PO TABS
500.0000 mg | ORAL_TABLET | Freq: Every day | ORAL | Status: DC
  Administered 2017-07-03 – 2017-07-05 (×3): 500 mg via ORAL
  Filled 2017-07-03 (×3): qty 1

## 2017-07-03 MED ORDER — VITAMIN D 1000 UNITS PO TABS
2000.0000 [IU] | ORAL_TABLET | Freq: Two times a day (BID) | ORAL | Status: DC
  Administered 2017-07-03 – 2017-07-05 (×4): 2000 [IU] via ORAL
  Filled 2017-07-03 (×4): qty 2

## 2017-07-03 NOTE — Progress Notes (Signed)
Inpatient Rehabilitation Admissions Coordinator  I will follow up with patient, daughter and brother tomorrow to clarify goals and expectations of an inpt rehab admission as well as caregiver support available.  Danne Baxter, RN, MSN Rehab Admissions Coordinator [phone] 07/03/2017 4:31 PM
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
## 2017-07-03 NOTE — Progress Notes (Signed)
Orthopedic Tech Progress Note Patient Details:  Darrell Harrison August 21, 1945 [id number]  Ortho Devices Ortho Device/Splint Location: Trapeze bar Ortho Device/Splint Interventions: Application   Post Interventions Instructions Provided: Adjustment of device   Darrell Harrison 07/03/2017, 12:56 PM
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
## 2017-07-03 NOTE — NC FL2 (Signed)
[place] LEVEL OF CARE SCREENING TOOL     IDENTIFICATION  Patient Name: Darrell Harrison. Birthdate: [birth year] Sex: male Admission Date (Current Location): 06/29/2017  [facility] and [place] Number:  Manufacturing engineer and Address:  [facility] [place], [place] [address], [place], [place] [address]      Provider Number: [id number]  Attending Physician Name and Address:  Md, Trauma, MD  Relative Name and Phone Number:       Current Level of Care: Hospital Recommended Level of Care: [place] Prior Approval Number:    Date Approved/Denied:   PASRR Number: [id number]  Discharge Plan: SNF    Current Diagnoses: Patient Active Problem List   Diagnosis Date Noted  . Closed posterior dislocation of right hip ([place]) 07/03/2017  . Acetabular fracture ([place]) 06/30/2017  . Closed displaced fracture of right acetabulum ([place])   . Malignant neoplasm of prostate ([place]) 09/11/2014    Orientation RESPIRATION BLADDER Height & Weight     Self, Time, Situation, Place  Normal Continent Weight: 201 lb (91.2 kg) Height:  5' 9.02" (175.3 cm)  BEHAVIORAL SYMPTOMS/MOOD NEUROLOGICAL BOWEL NUTRITION STATUS      Continent Diet(Regular Diet with Thin Liquids)  AMBULATORY STATUS COMMUNICATION OF NEEDS Skin   Extensive Assist Verbally Surgical wounds(Right HIp Incision)                       Personal Care Assistance Level of Assistance  Bathing, Feeding, Dressing Bathing Assistance: Limited assistance Feeding assistance: Independent Dressing Assistance: Limited assistance     Functional Limitations Info  Sight, Hearing, Speech Sight Info: Adequate Hearing Info: Adequate Speech Info: Adequate    SPECIAL CARE FACTORS FREQUENCY  PT (By licensed PT), OT (By licensed OT)     PT Frequency: 4x week OT Frequency: 4x week            Contractures Contractures Info: Not present    Additional Factors Info  Code Status, Allergies Code  Status Info: Full Code Allergies Info: No Known Allergies           Current Medications (07/03/2017):  This is the current hospital active medication list Current Facility-Administered Medications  Medication Dose Route Frequency Provider Last Rate Last Dose  . acetaminophen (TYLENOL) tablet 1,000 mg  1,000 mg Oral Q8H Earnstine Regal, PA-C   1,000 mg at 07/03/17 1002  . amLODipine (NORVASC) tablet 10 mg  10 mg Oral Daily Ainsley Spinner, PA-C   10 mg at 07/03/17 1002  . bisacodyl (DULCOLAX) EC tablet 5 mg  5 mg Oral Daily PRN Ainsley Spinner, PA-C      . cholecalciferol (VITAMIN D) tablet 2,000 Units  2,000 Units Oral BID Ainsley Spinner, PA-C   2,000 Units at 07/03/17 1226  . docusate sodium (COLACE) capsule 100 mg  100 mg Oral BID Ainsley Spinner, PA-C   100 mg at 07/03/17 1003  . enoxaparin (LOVENOX) injection 40 mg  40 mg Subcutaneous Q24H Ainsley Spinner, PA-C   40 mg at 07/03/17 1000  . hydrALAZINE (APRESOLINE) injection 10 mg  10 mg Intravenous Q2H PRN Ainsley Spinner, PA-C      . HYDROmorphone (DILAUDID) injection 0.5-1 mg  0.5-1 mg Intravenous Q2H PRN Ainsley Spinner, PA-C      . lactated ringers 1,000 mL with potassium chloride 10 mEq infusion   Intravenous Continuous Earnstine Regal, PA-C 100 mL/hr at 07/03/17 0809    . lactated ringers infusion   Intravenous Continuous Ainsley Spinner,  PA-C 10 mL/hr at 07/01/17 1023    . losartan (COZAAR) tablet 50 mg  50 mg Oral Daily Earnstine Regal, PA-C   50 mg at 07/03/17 1002  . methocarbamol (ROBAXIN) tablet 750 mg  750 mg Oral QID Ainsley Spinner, PA-C   750 mg at 07/03/17 1347   Or  . methocarbamol (ROBAXIN) 500 mg in dextrose 5 % 50 mL IVPB  500 mg Intravenous QID Ainsley Spinner, PA-C   Stopped at 07/01/17 2230  . metoCLOPramide (REGLAN) tablet 5-10 mg  5-10 mg Oral Q8H PRN Ainsley Spinner, PA-C       Or  . metoCLOPramide (REGLAN) injection 5-10 mg  5-10 mg Intravenous Q8H PRN Ainsley Spinner, PA-C      . ondansetron [facility]) tablet 4 mg  4 mg Oral Q6H PRN Ainsley Spinner, PA-C        Or  . ondansetron [facility]) injection 4 mg  4 mg Intravenous Q6H PRN Ainsley Spinner, PA-C   4 mg at 07/01/17 1650  . oxyCODONE (Oxy IR/ROXICODONE) immediate release tablet 10 mg  10 mg Oral Q4H PRN Ainsley Spinner, PA-C   10 mg at 07/02/17 2100  . polyethylene glycol (MIRALAX / GLYCOLAX) packet 17 g  17 g Oral Daily Ainsley Spinner, PA-C   17 g at 07/03/17 1003  . traMADol (ULTRAM) tablet 50 mg  50 mg Oral Q6H Earnstine Regal, PA-C   50 mg at 07/03/17 1226  . vitamin C (ASCORBIC ACID) tablet 500 mg  500 mg Oral Daily Ainsley Spinner, PA-C   500 mg at 07/03/17 1226     Discharge Medications: Please see discharge summary for a list of discharge medications.  Relevant Imaging Results:  Relevant Lab Results:   Additional Information SSN [id number]   Barbette Or, [place]

## 2017-07-03 NOTE — Progress Notes (Signed)
Inpatient Rehabilitation Admissions Coordinator  Notified that patient has caregiver support after any rehab venue. I contacted SW, Denyse Amass, for clarification of caregiver support and if available would recommend a CIR consult order. Please clarify.  Danne Baxter, RN, MSN Rehab Admissions Coordinator [phone] 07/03/2017 12:12 PM
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
## 2017-07-04 ENCOUNTER — Inpatient Hospital Stay (HOSPITAL_COMMUNITY): Payer: Medicare HMO

## 2017-07-04 LAB — BASIC METABOLIC PANEL
ANION GAP: 3 — AB (ref 5–15)
BUN: 14 mg/dL (ref 6–20)
CHLORIDE: 103 mmol/L (ref 101–111)
CO2: 30 mmol/L (ref 22–32)
Calcium: 8.1 mg/dL — ABNORMAL LOW (ref 8.9–10.3)
Creatinine, Ser: 0.94 mg/dL (ref 0.61–1.24)
GFR calc Af Amer: >60 mL/min (ref >60)
GLUCOSE: 111 mg/dL — AB (ref 65–99)
POTASSIUM: 4.4 mmol/L (ref 3.5–5.1)
Sodium: 136 mmol/L (ref 135–145)

## 2017-07-04 LAB — CBC
HEMATOCRIT: 30.7 % — AB (ref 39.0–52.0)
HEMOGLOBIN: 10.1 g/dL — AB (ref 13.0–17.0)
MCH: 27.7 pg (ref 26.0–34.0)
MCHC: 32.9 g/dL (ref 30.0–36.0)
MCV: 84.1 fL (ref 78.0–100.0)
Platelets: 253 10*3/uL (ref 150–400)
RBC: 3.65 MIL/uL — ABNORMAL LOW (ref 4.22–5.81)
RDW: 14.1 % (ref 11.5–15.5)
WBC: 9.9 10*3/uL (ref 4.0–10.5)
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
## 2017-07-04 NOTE — Progress Notes (Addendum)
Inpatient Rehabilitation Admissions Coordinator  I met with patient at bedside with his brother, Gerald Stabs and sister, Legrand Rams at bedside. We discussed goals and expectations of an inpt rehab admit. They prefer CIR rather than SNF. I contacted pt's daughter, Virgilio Belling , and she prefers CIR also. I will begin insurance approval with [employer] for possible admit tomorrow pending medical clearance and insurance approval.  Danne Baxter, RN, MSN Rehab Admissions Coordinator [phone] 07/04/2017 1:26 PM

## 2017-07-05 ENCOUNTER — Inpatient Hospital Stay (HOSPITAL_COMMUNITY)
Admission: RE | Admit: 2017-07-05 | Discharge: 2017-07-12 | DRG: 560 | Disposition: A | Discharge status: Home Health | Payer: Medicare HMO | Source: Intra-hospital | Attending: Physical Medicine & Rehabilitation | Admitting: Physical Medicine & Rehabilitation

## 2017-07-05 ENCOUNTER — Other Ambulatory Visit: Payer: Self-pay

## 2017-07-05 ENCOUNTER — Encounter (HOSPITAL_COMMUNITY): Payer: Self-pay | Admitting: *Deleted

## 2017-07-05 DIAGNOSIS — I10 Essential (primary) hypertension: Secondary | ICD-10-CM | POA: Diagnosis not present

## 2017-07-05 DIAGNOSIS — S32441S Displaced fracture of posterior column [ilioischial] of right acetabulum, sequela: Secondary | ICD-10-CM | POA: Diagnosis not present

## 2017-07-05 DIAGNOSIS — D62 Acute posthemorrhagic anemia: Secondary | ICD-10-CM | POA: Diagnosis present

## 2017-07-05 DIAGNOSIS — M7989 Other specified soft tissue disorders: Secondary | ICD-10-CM | POA: Diagnosis not present

## 2017-07-05 DIAGNOSIS — Z7141 Alcohol abuse counseling and surveillance of alcoholic: Secondary | ICD-10-CM | POA: Diagnosis not present

## 2017-07-05 DIAGNOSIS — Z79899 Other long term (current) drug therapy: Secondary | ICD-10-CM | POA: Diagnosis not present

## 2017-07-05 DIAGNOSIS — S73014A Posterior dislocation of right hip, initial encounter: Secondary | ICD-10-CM | POA: Diagnosis not present

## 2017-07-05 DIAGNOSIS — Z8546 Personal history of malignant neoplasm of prostate: Secondary | ICD-10-CM

## 2017-07-05 DIAGNOSIS — E8809 Other disorders of plasma-protein metabolism, not elsewhere classified: Secondary | ICD-10-CM | POA: Diagnosis present

## 2017-07-05 DIAGNOSIS — T148XXA Other injury of unspecified body region, initial encounter: Secondary | ICD-10-CM | POA: Diagnosis not present

## 2017-07-05 DIAGNOSIS — G479 Sleep disorder, unspecified: Secondary | ICD-10-CM

## 2017-07-05 DIAGNOSIS — Z823 Family history of stroke: Secondary | ICD-10-CM | POA: Diagnosis not present

## 2017-07-05 DIAGNOSIS — R3915 Urgency of urination: Secondary | ICD-10-CM | POA: Diagnosis present

## 2017-07-05 DIAGNOSIS — S2220XD Unspecified fracture of sternum, subsequent encounter for fracture with routine healing: Secondary | ICD-10-CM

## 2017-07-05 DIAGNOSIS — S2241XD Multiple fractures of ribs, right side, subsequent encounter for fracture with routine healing: Secondary | ICD-10-CM | POA: Diagnosis not present

## 2017-07-05 DIAGNOSIS — F1010 Alcohol abuse, uncomplicated: Secondary | ICD-10-CM | POA: Diagnosis present

## 2017-07-05 DIAGNOSIS — S32441D Displaced fracture of posterior column [ilioischial] of right acetabulum, subsequent encounter for fracture with routine healing: Secondary | ICD-10-CM | POA: Diagnosis not present

## 2017-07-05 DIAGNOSIS — R269 Unspecified abnormalities of gait and mobility: Secondary | ICD-10-CM | POA: Diagnosis not present

## 2017-07-05 DIAGNOSIS — Z8249 Family history of ischemic heart disease and other diseases of the circulatory system: Secondary | ICD-10-CM

## 2017-07-05 DIAGNOSIS — E46 Unspecified protein-calorie malnutrition: Secondary | ICD-10-CM

## 2017-07-05 DIAGNOSIS — G8918 Other acute postprocedural pain: Secondary | ICD-10-CM

## 2017-07-05 DIAGNOSIS — Z809 Family history of malignant neoplasm, unspecified: Secondary | ICD-10-CM | POA: Diagnosis not present

## 2017-07-05 DIAGNOSIS — S32401D Unspecified fracture of right acetabulum, subsequent encounter for fracture with routine healing: Secondary | ICD-10-CM | POA: Diagnosis not present

## 2017-07-05 LAB — BASIC METABOLIC PANEL
Anion gap: 7 (ref 5–15)
BUN: 14 mg/dL (ref 6–20)
CALCIUM: 8.3 mg/dL — AB (ref 8.9–10.3)
CHLORIDE: 102 mmol/L (ref 101–111)
CO2: 28 mmol/L (ref 22–32)
CREATININE: 0.99 mg/dL (ref 0.61–1.24)
GFR calc non Af Amer: >60 mL/min (ref >60)
Glucose, Bld: 106 mg/dL — ABNORMAL HIGH (ref 65–99)
Potassium: 3.9 mmol/L (ref 3.5–5.1)
SODIUM: 137 mmol/L (ref 135–145)

## 2017-07-05 MED ORDER — DOCUSATE SODIUM 100 MG PO CAPS
100.0000 mg | ORAL_CAPSULE | Freq: Two times a day (BID) | ORAL | Status: DC
  Administered 2017-07-05 – 2017-07-12 (×14): 100 mg via ORAL
  Filled 2017-07-05 (×14): qty 1

## 2017-07-05 MED ORDER — VITAMIN D 1000 UNITS PO TABS
2000.0000 [IU] | ORAL_TABLET | Freq: Two times a day (BID) | ORAL | Status: DC
  Administered 2017-07-05 – 2017-07-12 (×14): 2000 [IU] via ORAL
  Filled 2017-07-05 (×14): qty 2

## 2017-07-05 MED ORDER — ONDANSETRON HCL 4 MG/2ML IJ SOLN: 4.0000 mg | Freq: Four times a day (QID) | INTRAMUSCULAR | Status: DC | PRN

## 2017-07-05 MED ORDER — ACETAMINOPHEN 500 MG PO TABS: 1000.0000 mg | ORAL_TABLET | Freq: Three times a day (TID) | ORAL | 0 refills | Status: DC

## 2017-07-05 MED ORDER — METHOCARBAMOL 750 MG PO TABS
750.0000 mg | ORAL_TABLET | Freq: Four times a day (QID) | ORAL | Status: DC
  Administered 2017-07-05 – 2017-07-08 (×10): 750 mg via ORAL
  Filled 2017-07-05 (×10): qty 1

## 2017-07-05 MED ORDER — BISACODYL 5 MG PO TBEC: 5.0000 mg | DELAYED_RELEASE_TABLET | Freq: Every day | ORAL | 0 refills | Status: DC | PRN

## 2017-07-05 MED ORDER — ENOXAPARIN SODIUM 40 MG/0.4ML ~~LOC~~ SOLN
40.0000 mg | SUBCUTANEOUS | Status: DC
  Administered 2017-07-06 – 2017-07-12 (×7): 40 mg via SUBCUTANEOUS
  Filled 2017-07-05 (×7): qty 0.4

## 2017-07-05 MED ORDER — OXYCODONE HCL 10 MG PO TABS: 10.0000 mg | ORAL_TABLET | ORAL | 0 refills | Status: DC | PRN

## 2017-07-05 MED ORDER — METHOCARBAMOL 750 MG PO TABS: 750.0000 mg | ORAL_TABLET | Freq: Four times a day (QID) | ORAL | Status: DC

## 2017-07-05 MED ORDER — LOSARTAN POTASSIUM 50 MG PO TABS: 50.0000 mg | ORAL_TABLET | Freq: Every day | ORAL | Status: DC

## 2017-07-05 MED ORDER — ENOXAPARIN SODIUM 40 MG/0.4ML ~~LOC~~ SOLN: 40.0000 mg | SUBCUTANEOUS | Status: DC

## 2017-07-05 MED ORDER — DOCUSATE SODIUM 100 MG PO CAPS: 100.0000 mg | ORAL_CAPSULE | Freq: Two times a day (BID) | ORAL | 0 refills | Status: DC

## 2017-07-05 MED ORDER — ONDANSETRON HCL 4 MG PO TABS: 4.0000 mg | ORAL_TABLET | Freq: Four times a day (QID) | ORAL | Status: DC | PRN

## 2017-07-05 MED ORDER — HYDRALAZINE HCL 20 MG/ML IJ SOLN: 10.0000 mg | INTRAMUSCULAR | Status: DC | PRN

## 2017-07-05 MED ORDER — LOSARTAN POTASSIUM 50 MG PO TABS
50.0000 mg | ORAL_TABLET | Freq: Every day | ORAL | Status: DC
  Administered 2017-07-06 – 2017-07-12 (×7): 50 mg via ORAL
  Filled 2017-07-05 (×7): qty 1

## 2017-07-05 MED ORDER — ONDANSETRON HCL 4 MG PO TABS: 4.0000 mg | ORAL_TABLET | Freq: Four times a day (QID) | ORAL | 0 refills | Status: DC | PRN

## 2017-07-05 MED ORDER — CHOLECALCIFEROL 50 MCG (2000 UT) PO TABS: 2000.0000 [IU] | ORAL_TABLET | Freq: Two times a day (BID) | ORAL | Status: DC

## 2017-07-05 MED ORDER — ACETAMINOPHEN 325 MG PO TABS: 325.0000 mg | ORAL_TABLET | ORAL | Status: DC | PRN

## 2017-07-05 MED ORDER — TRAMADOL HCL 50 MG PO TABS
50.0000 mg | ORAL_TABLET | Freq: Four times a day (QID) | ORAL | Status: DC
  Administered 2017-07-06 – 2017-07-08 (×9): 50 mg via ORAL
  Filled 2017-07-05 (×10): qty 1

## 2017-07-05 MED ORDER — VITAMIN C 500 MG PO TABS
500.0000 mg | ORAL_TABLET | Freq: Every day | ORAL | Status: DC
  Administered 2017-07-06 – 2017-07-12 (×7): 500 mg via ORAL
  Filled 2017-07-05 (×7): qty 1

## 2017-07-05 MED ORDER — POLYETHYLENE GLYCOL 3350 17 G PO PACK
17.0000 g | PACK | Freq: Every day | ORAL | Status: DC
  Administered 2017-07-05 – 2017-07-09 (×5): 17 g via ORAL
  Filled 2017-07-05 (×6): qty 1

## 2017-07-05 MED ORDER — METOCLOPRAMIDE HCL 5 MG PO TABS: 5.0000 mg | ORAL_TABLET | Freq: Three times a day (TID) | ORAL | Status: DC | PRN

## 2017-07-05 MED ORDER — AMLODIPINE BESYLATE 10 MG PO TABS
10.0000 mg | ORAL_TABLET | Freq: Every day | ORAL | Status: DC
  Administered 2017-07-06 – 2017-07-12 (×7): 10 mg via ORAL
  Filled 2017-07-05 (×7): qty 1

## 2017-07-05 MED ORDER — POLYETHYLENE GLYCOL 3350 17 G PO PACK: 17.0000 g | PACK | Freq: Every day | ORAL | 0 refills | Status: DC

## 2017-07-05 MED ORDER — OXYCODONE HCL 5 MG PO TABS: 10.0000 mg | ORAL_TABLET | ORAL | Status: DC | PRN

## 2017-07-05 MED ORDER — BISACODYL 5 MG PO TBEC: 5.0000 mg | DELAYED_RELEASE_TABLET | Freq: Every day | ORAL | Status: DC | PRN

## 2017-07-05 MED ORDER — ASCORBIC ACID 500 MG PO TABS: 500.0000 mg | ORAL_TABLET | Freq: Every day | ORAL | Status: DC

## 2017-07-05 MED ORDER — HYDROMORPHONE HCL 1 MG/ML IJ SOLN: 0.5000 mg | INTRAMUSCULAR | 0 refills | Status: DC | PRN

## 2017-07-05 MED ORDER — TAMSULOSIN HCL 0.4 MG PO CAPS
0.4000 mg | ORAL_CAPSULE | Freq: Every day | ORAL | Status: DC
  Administered 2017-07-05 – 2017-07-11 (×7): 0.4 mg via ORAL
  Filled 2017-07-05 (×7): qty 1

## 2017-07-05 MED ORDER — TRAMADOL HCL 50 MG PO TABS: 50.0000 mg | ORAL_TABLET | Freq: Four times a day (QID) | ORAL | Status: DC
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
## 2017-07-05 NOTE — Progress Notes (Signed)
Inpatient Rehabilitation Admissions Coordinator  I have insurance approval to admit pt to inpt rehab today. I will contact Attending service to arrange d/c to CIR. Patient and his daughter in agreement to admit today. RN CM and SW alerted. I will make the arrangements to admit today.   Danne Baxter, RN, MSN Rehab Admissions Coordinator [phone] 07/05/2017 5:24 PM

## 2017-07-05 NOTE — Clinical Social Work Note (Signed)
Clinical Social Worker continuing to follow patient and family for support and discharge planning needs.  Patient has a bed available at [facility], however no insurance authorization has been started due to an open case with inpatient rehab.  Patient and family prefer CIR placement over SNF - insurance pending for CIR.  CSW remains available for support and to facilitate patient discharge plans if needed.  Barbette Or, [place]

## 2017-07-05 NOTE — Progress Notes (Signed)
Pt admitted to unit. Accompanied by RN and daughter. Pt and family state understanding fall prevention protocol. Pt does not seem to be in any distress.  Gerald Stabs, LPN
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
## 2017-07-05 NOTE — Discharge Instructions (Signed)
Sternal Fracture A sternal fracture is a break in the bone in the center of your chest (sternum or breastbone). This fracture is not dangerous unless there is also an injury to your heart or lungs, which are protected by the sternum and ribs. What are the causes? This condition is usually caused by a forceful injury from:  Motor vehicle collisions. This is the most common cause.  Contact sports.  Physical assaults.  You can also have a sternal fracture without having a forceful injury if the bone becomes weakened over time (stress fracture or insufficiency fracture). What increases the risk? You may be at greater risk for a sternal fracture if you:  Participate in direct contact sports, such as football or wrestling.  Work at elevated heights, such as in Architect.  Other risk factors for a stress or insufficiency sternal fracture include:  Being male.  Being a postmenopausal woman.  Being age 37 or older.  Having osteoporosis.  Having severe curvature of the spine.  Being on long-term steroid treatment.  What are the signs or symptoms? Symptoms of this condition include:  Pain over the sternum.  Pain when pressing on the sternum.  Pain that gets worse with deep breathing or coughing.  Shortness of breath.  Bruising.  Swelling.  A crackling sound when taking a deep breath or pressing on the sternum.  How is this diagnosed? This condition is diagnosed with a medical history and physical exam. You may also have imaging tests, including:  CT scan.  Ultrasound.  Chest X-rays that are taken from a side view.  Your health care provider may check your blood oxygen level with a pulse oximetry test. You may also have repeated electrocardiograms (ECGs) to make sure that your heart has not been injured. You may also have a blood test to check for damage to your heart muscle. How is this treated? Treatment depends on the severity of your injury. A sternal  fracture without any other injury (isolated sternal fracture) usually heals without treatment. You may need to limit (restrict) some activities at home and take medicine for pain relief. In rare cases, you may need surgery to repair a sternal fracture that continues to cause severe pain or a sternal fracture that involves bones that have been moved out of position considerably (displaced fracture). Follow these instructions at home:  Take over-the-counter and prescription medicines only as told by your health care provider.  Rest at home. Return to your normal activities as told by your health care provider. Ask your health care provider what activities are safe for you.  If directed, apply ice to the injured area: ? Put ice in a plastic bag. ? Place a towel between your skin and the bag. ? Leave the ice on for 20 minutes, 2-3 times a day.  Do not lift anything that is heavier than 10 lb (4.5 kg) until your health care provider says it is safe.  Do not drive or operate heavy machinery while taking prescription pain medicine.  Do not use any tobacco products, such as cigarettes, chewing tobacco, and e-cigarettes. If you need help quitting, ask your health care provider.  Keep all follow-up visits as told by your health care provider. This is important. Contact a health care provider if:  Your pain medicine is not helping.  You continue to have pain after several weeks.  You develop a fever.  You develop a cough and you have thick or bloody mucus (sputum). Get help right away if:  You have difficulty breathing.  You have chest pain.  You have an abnormal heartbeat (palpitations).  You feel nauseous or you have pain in your abdomen. This information is not intended to replace advice given to you by your health care provider. Make sure you discuss any questions you have with your health care provider. Document Released: 08/30/2003 Document Revised: 09/13/2015 Document Reviewed:  08/11/2014 Elsevier Interactive Patient Education  2018 [employer].   Rib Fracture A rib fracture is a break or crack in one of the bones of the ribs. The ribs are like a cage that goes around your upper chest. A broken or cracked rib is often painful, but most do not cause other problems. Most rib fractures heal on their own in 1-3 months. Follow these instructions at home:  Avoid activities that cause pain to the injured area. Protect your injured area.  Slowly increase activity as told by your doctor.  Take medicine as told by your doctor.  Put ice on the injured area for the first 1-2 days after you have been treated or as told by your doctor. ? Put ice in a plastic bag. ? Place a towel between your skin and the bag. ? Leave the ice on for 15-20 minutes at a time, every 2 hours while you are awake.  Do deep breathing as told by your doctor. You may be told to: ? Take deep breaths many times a day. ? Cough many times a day while hugging a pillow. ? Use a device (incentive spirometer) to perform deep breathing many times a day.  Drink enough fluids to keep your pee (urine) clear or pale yellow.  Do not wear a rib belt or binder. These do not allow you to breathe deeply. Get help right away if:  You have a fever.  You have trouble breathing.  You cannot stop coughing.  You cough up thick or bloody spit (mucus).  You feel sick to your stomach (nauseous), throw up (vomit), or have belly (abdominal) pain.  Your pain gets worse and medicine does not help. This information is not intended to replace advice given to you by your health care provider. Make sure you discuss any questions you have with your health care provider. Document Released: 10/25/2007 Document Revised: 06/23/2015 Document Reviewed: 03/19/2012 Elsevier Interactive Patient Education  [employer].
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
## 2017-07-05 NOTE — Progress Notes (Signed)
Inpatient Rehabilitation Admissions Coordinator  I spoke with Dr. Ninfa Linden by phone and he will make arrangements to d/c to CIR today.  Danne Baxter, RN, MSN Rehab Admissions Coordinator [phone] 07/05/2017 5:41 PM

## 2017-07-06 ENCOUNTER — Inpatient Hospital Stay (HOSPITAL_COMMUNITY): Payer: Medicare HMO | Admitting: Occupational Therapy

## 2017-07-06 ENCOUNTER — Inpatient Hospital Stay (HOSPITAL_COMMUNITY): Payer: Medicare HMO | Admitting: Physical Therapy
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
## 2017-07-06 NOTE — Plan of Care (Signed)
LTGs established 07/06/17
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
## 2017-07-07 ENCOUNTER — Inpatient Hospital Stay (HOSPITAL_COMMUNITY): Payer: Medicare HMO | Admitting: Occupational Therapy

## 2017-07-07 ENCOUNTER — Inpatient Hospital Stay (HOSPITAL_COMMUNITY): Payer: Medicare HMO

## 2017-07-07 DIAGNOSIS — M7989 Other specified soft tissue disorders: Secondary | ICD-10-CM
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
## 2017-07-07 NOTE — Progress Notes (Signed)
LE venous duplex prelim: negative for DVT. Anitria Andon Eunice, RDMS, RVT  

## 2017-07-07 NOTE — Progress Notes (Signed)
Occupational Therapy Session Note  Patient Details  Name: Darrell Harrison. MRN: [id number] Date of Birth: 1945-08-05  Today's Date: 07/07/2017 OT Individual Time: 5697-9480 OT Individual Time Calculation (min): 62 min   Short Term Goals: Week 1:  OT Short Term Goal 1 (Week 1): STGs=LTGs due to ELOS  Skilled Therapeutic Interventions/Progress Updates:    Pt greeted supine in bed. No c/o pain, requesting to shower. Tx focus on functional transfers, AE training/precaution adherence, and sit<stands during bathing and dressing at shower level. All functional stand pivot transfers completed with steady assist. Pt mindful of TDWB precautions. He bathed while seated on TTB using LH sponge and lateral leans as needed. Afterwards initiated AE training when dressing w/c level at sink. He required assist for proper use of sock aide but able to complete 3/3 components of donning pants using reacher. Min A sit<stands. Teds donned Total A. Oral care/grooming tasks completed with setup while seated. At end of session pt was left with brother and all needs within reach.   Therapy Documentation Precautions:  Precautions Precautions: Posterior Hip, Fall Precaution Comments: Per chart review, MD states no sternal precautions Restrictions Weight Bearing Restrictions: Yes RLE Weight Bearing: Touchdown weight bearing Pain: Pain Assessment Pain Scale: 0-10 Pain Score: 0-No pain ADL: ADL ADL Comments: Please see functional navigator for ADL status     See Function Navigator for Current Functional Status.   Therapy/Group: Individual Therapy  Vedanshi Massaro A Wagner Tanzi 07/07/2017, 12:32 PM
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
## 2017-07-08 ENCOUNTER — Inpatient Hospital Stay (HOSPITAL_COMMUNITY): Payer: Medicare HMO | Admitting: Occupational Therapy

## 2017-07-08 ENCOUNTER — Inpatient Hospital Stay (HOSPITAL_COMMUNITY): Payer: Medicare HMO

## 2017-07-08 ENCOUNTER — Inpatient Hospital Stay (HOSPITAL_COMMUNITY): Payer: Medicare HMO | Admitting: Physical Therapy

## 2017-07-08 DIAGNOSIS — E8809 Other disorders of plasma-protein metabolism, not elsewhere classified: Secondary | ICD-10-CM

## 2017-07-08 DIAGNOSIS — E46 Unspecified protein-calorie malnutrition: Secondary | ICD-10-CM

## 2017-07-08 DIAGNOSIS — R269 Unspecified abnormalities of gait and mobility: Secondary | ICD-10-CM

## 2017-07-08 DIAGNOSIS — G8918 Other acute postprocedural pain: Secondary | ICD-10-CM

## 2017-07-08 DIAGNOSIS — S32441S Displaced fracture of posterior column [ilioischial] of right acetabulum, sequela: Secondary | ICD-10-CM

## 2017-07-08 LAB — CBC WITH DIFFERENTIAL/PLATELET
ABS IMMATURE GRANULOCYTES: 0.1 10*3/uL (ref 0.0–0.1)
Basophils Absolute: 0 10*3/uL (ref 0.0–0.1)
Basophils Relative: 1 %
EOS ABS: 0.2 10*3/uL (ref 0.0–0.7)
Eosinophils Relative: 2 %
HCT: 31.1 % — ABNORMAL LOW (ref 39.0–52.0)
HEMOGLOBIN: 10 g/dL — AB (ref 13.0–17.0)
IMMATURE GRANULOCYTES: 1 %
LYMPHS ABS: 1.4 10*3/uL (ref 0.7–4.0)
LYMPHS PCT: 16 %
MCH: 27.8 pg (ref 26.0–34.0)
MCHC: 32.2 g/dL (ref 30.0–36.0)
MCV: 86.4 fL (ref 78.0–100.0)
Monocytes Absolute: 0.8 10*3/uL (ref 0.1–1.0)
Monocytes Relative: 9 %
NEUTROS ABS: 6 10*3/uL (ref 1.7–7.7)
NEUTROS PCT: 71 %
Platelets: 374 10*3/uL (ref 150–400)
RBC: 3.6 MIL/uL — AB (ref 4.22–5.81)
RDW: 14.4 % (ref 11.5–15.5)
WBC: 8.5 10*3/uL (ref 4.0–10.5)

## 2017-07-08 LAB — COMPREHENSIVE METABOLIC PANEL
ALBUMIN: 2.5 g/dL — AB (ref 3.5–5.0)
ALT: 74 U/L — AB (ref 17–63)
AST: 54 U/L — AB (ref 15–41)
Alkaline Phosphatase: 59 U/L (ref 38–126)
Anion gap: 9 (ref 5–15)
BILIRUBIN TOTAL: 0.6 mg/dL (ref 0.3–1.2)
BUN: 16 mg/dL (ref 6–20)
CO2: 27 mmol/L (ref 22–32)
CREATININE: 1.03 mg/dL (ref 0.61–1.24)
Calcium: 8.6 mg/dL — ABNORMAL LOW (ref 8.9–10.3)
Chloride: 104 mmol/L (ref 101–111)
GFR calc Af Amer: >60 mL/min (ref >60)
GFR calc non Af Amer: >60 mL/min (ref >60)
Glucose, Bld: 114 mg/dL — ABNORMAL HIGH (ref 65–99)
POTASSIUM: 4.3 mmol/L (ref 3.5–5.1)
Sodium: 140 mmol/L (ref 135–145)
TOTAL PROTEIN: 5.6 g/dL — AB (ref 6.5–8.1)

## 2017-07-08 MED ORDER — METHOCARBAMOL 750 MG PO TABS
750.0000 mg | ORAL_TABLET | Freq: Four times a day (QID) | ORAL | Status: DC | PRN
  Administered 2017-07-09 – 2017-07-12 (×5): 750 mg via ORAL
  Filled 2017-07-08 (×5): qty 1

## 2017-07-08 MED ORDER — TRAMADOL HCL 50 MG PO TABS
50.0000 mg | ORAL_TABLET | Freq: Four times a day (QID) | ORAL | Status: DC | PRN
  Administered 2017-07-09 – 2017-07-12 (×6): 50 mg via ORAL
  Filled 2017-07-08 (×6): qty 1

## 2017-07-08 MED ORDER — PRO-STAT SUGAR FREE PO LIQD
30.0000 mL | Freq: Two times a day (BID) | ORAL | Status: DC
  Administered 2017-07-08 – 2017-07-12 (×9): 30 mL via ORAL
  Filled 2017-07-08 (×8): qty 30

## 2017-07-08 MED ORDER — OXYCODONE HCL 5 MG PO TABS: 10.0000 mg | ORAL_TABLET | ORAL | Status: DC | PRN

## 2017-07-08 NOTE — Progress Notes (Signed)
Occupational Therapy Session Note  Patient Details  Name: Darrell Harrison. MRN: [id number] Date of Birth: May 21, 1945  Today's Date: 07/08/2017 OT Individual Time: 1120-1200 and 0300-9233  OT Individual Time Calculation (min): 40 min and 39 min   Short Term Goals: Week 1:  OT Short Term Goal 1 (Week 1): STGs=LTGs due to ELOS  Skilled Therapeutic Interventions/Progress Updates:    1) Treatment session with focus on functional mobility and bathroom transfers.  Pt received upright in w/c already bathed and dressed with previous therapy session.  Engaged in functional transfers in ADL apt bathroom.  Educated on transfer technique with tub transfer bench, pt able to complete tub/shower transfer with tub bench with min guard and RW.  Completed toilet transfers with BSC over toilet to allow for UE support, pt able to complete with min guard. Engaged in functional mobility in ADL apt with RW on carpet with min guard.  Completed furniture transfers supervision with RW.  Educated on modifications to home setup to increase independence with sit > stand and transfers from various furniture items.  Pt ambulated 60' with RW with min guard back to room.   2) Treatment session with focus on sit <> stand and standing tolerance.  Pt propelled w/c > 300' for BUE strengthening and endurance.  Engaged in [place] bowling activity with focus on sit > stand and standing tolerance.  Pt tolerated standing 5 mins, 3 mins, and 4 mins with min guard while Wii bowling.  Pt able to maintain TDWB during standing activity with single UE support on RW.  Pt propelled back to room as above and left upright in w/c with all needs in reach.  Therapy Documentation Precautions:  Precautions Precautions: Posterior Hip, Fall Precaution Comments: Per chart review, MD states no sternal precautions Restrictions Weight Bearing Restrictions: Yes RLE Weight Bearing: Touchdown weight bearing Pain:  Pt reports mild discomfort in ribs.   Premedicated  See Function Navigator for Current Functional Status.   Therapy/Group: Individual Therapy  Simonne Come 07/08/2017, 12:02 PM

## 2017-07-08 NOTE — Progress Notes (Signed)
Patient information reviewed and entered into eRehab system by Laine Fonner, RN, CRRN, PPS Coordinator.  Information including medical coding and functional independence measure will be reviewed and updated through discharge.     Per nursing patient was given "Data Collection Information Summary for Patients in Inpatient Rehabilitation Facilities with attached "Privacy Act Statement-Health Care Records" upon admission.  

## 2017-07-08 NOTE — Progress Notes (Signed)
Occupational Therapy Session Note  Patient Details  Name: Darrell Harrison. MRN: [id number] Date of Birth: 06-29-1945  Today's Date: 07/08/2017 OT Individual Time: 1120-1200 OT Individual Time Calculation (min): 40 min    Short Term Goals: Week 1:  OT Short Term Goal 1 (Week 1): STGs=LTGs due to ELOS  Skilled Therapeutic Interventions/Progress Updates:    Pt sitting in w/c agreeable to therapy with no c/o pain. Session focused on activity tolerance and sit to stand transfer during b/d tasks as well as B UE strength/conditioning. Pt completed 4x sit to stand transfers with (S) and good adherence to TDWB R LE. Pt stood for ~5 min to complete clothing management and anterior/posterior peri-hygiene with CGA when releasing B UE from sink. RN entered room to perform bandage change on R hip, with pt standing throughout. Pt used reacher to don underwear and pants with (S) level seated in w/c. Pt was instructed in B UE strengthening/conditioning circuit, using a 3lb weighted dowel to perform elbow extension/ flexion, shoulder flexion and horizontal abd/adduction. Pt was encouraged to take rest break but breaks were graded to challenge pt's cardiorespiratory endurance. Pt was left sitting in w/c with family present and all needs met.   Therapy Documentation Precautions:  Precautions Precautions: Posterior Hip, Fall Precaution Comments: Per chart review, MD states no sternal precautions Restrictions Weight Bearing Restrictions: Yes RLE Weight Bearing: Touchdown weight bearing   Vital Signs: Therapy Vitals Temp: 98.2 F (36.8 C) Temp Source: Oral Pulse Rate: 74 Resp: 18 BP: 130/80 Patient Position (if appropriate): Lying Oxygen Therapy SpO2: 99 % O2 Device: Room Air Pain: Pain Assessment Pain Scale: 0-10 Pain Score: 0-No pain Pain Type: Acute pain Pain Location: Leg Pain Orientation: Right Pain Descriptors / Indicators: Sore Pain Frequency: Intermittent Pain Onset:  On-going Patients Stated Pain Goal: 0 Pain Intervention(s): Medication (See eMAR) ADL: ADL ADL Comments: Please see functional navigator for ADL status  See Function Navigator for Current Functional Status.   Therapy/Group: Individual Therapy  Curtis Sites 07/08/2017, 8:43 AM
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
## 2017-07-08 NOTE — Progress Notes (Signed)
  Radiation Oncology         [phone]) [phone] ________________________________  Name: Darrell Harrison. MRN: [id number]  Date: 07/02/2017  DOB: 01-03-1946  End of Treatment Note  Diagnosis:   Right acetabular fracture with risk of postoperative heterotopic ossification     Indication for treatment:  curative       Radiation treatment dates:   07/02/2017  Site/dose:   The patient was treated to a dose of 7 Gy to the right hip. This was accomplished with AP and PA fields.  Narrative: The patient tolerated radiation treatment relatively well.   No difficulties.  Plan: The patient has completed radiation treatment. The patient will return to radiation oncology clinic on an as needed basis. ________________________________  Jodelle Gross, MD, PhD  This document serves as a record of services personally performed by Kyung Rudd, MD. It was created on his behalf by Rae Lips, a trained medical scribe. The creation of this record is based on the scribe's personal observations and the provider's statements to them. This document has been checked and approved by the attending provider.

## 2017-07-08 NOTE — Progress Notes (Signed)
Physical Therapy Session Note  Patient Details  Name: Darrell Harrison. MRN: [id number] Date of Birth: [birth year]  Today's Date: 07/08/2017 PT Individual Time: 0803-0900 and 0240-9735 PT Individual Time Calculation (min): 57 min and 42 min  Short Term Goals: Week 1:  PT Short Term Goal 1 (Week 1): =LTGs due to ELOS  Skilled Therapeutic Interventions/Progress Updates: Tx1: Pt presented in bed agreeable to therapy. Pt denies pain in hip but c/o "soreness" in ribs. Pt able to recall 3/3 hip precautions. Performed supine to sit with min guard and use of bed features. Performed sit to stand with one hand on RW. Pt able to maintain standing with only RW while PTA applied tape to secure dressing. Performed stand pivot to w/c min guard with pt keeping RLE off floor. Per pt prefers to be NWB vs TDWB. Pt propelled w/c to rehab gym for UE strengthening and endurance. Participated in standing therex in parallel bars including standing SLR, hip abd/add, hip flexion to 4in step, and standing HS curls 2x10 with x 1 seated rest due to fatigue. Performed standing horseshoe toss with RW with pt using reacher to pick up horseshoes for standing tolerance. Participated in gait training with RW for endurance. Pt continued to maintain NWB with gait with pt ambulating 63f, 52f 6038fand 62f79fth seated rests between. Pt transported back to room and remained in w/c with call bell within reach and needs met.   Tx2: Pt presented in w/c with 2 sisters present and agreeable to therapy. Pt requesting to use toilet prior to leaving room. Pt ambulated to toilet min guard and was able to doff brief and pants with min guard and holding wall rail (+BM). Pt ambulated from toilet to sink to perform hand hygiene and able to maintain TDWB while washing hands. Pt propelled to rehab gym and performed ascend/descending stairs x 4 with 2 rails and min guard. Pt hopped each step and required a seated rest break after stairs. Pt  transported to mat and performed squat pivot w/c to mat min guard. Participated in supine and seated therex including SLR, hip abd/add, SAQ, hip ER with level 2 resistance band and LAQ, 2 x 10 each. Pt required min cues for technique with hip abd/add and SLR but able to tolerate with no c/o pain. Performed blocked practice supine to/from sit on mat with wedge placed due to increased chest pressure per pt. Pt instructed in using pillow between legs to maintain hip precautions and perform log roll technique. Pt able to perform with minA and no increase in rib discomfort per pt. Pt performed squat pivot to w/c in same manner as prior and propelled back to room. Pt remained in w/c at end of session with call bell within reach and family present.      Therapy Documentation Precautions:  Precautions Precautions: Posterior Hip, Fall Precaution Comments: Per chart review, MD states no sternal precautions Restrictions Weight Bearing Restrictions: Yes RLE Weight Bearing: Touchdown weight bearing  See Function Navigator for Current Functional Status.   Therapy/Group: Individual Therapy  Graison Leinberger  Dicy Smigel, PTA  07/08/2017, 12:13 PM
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
## 2017-07-08 NOTE — Progress Notes (Signed)
Spoke with Dr. Posey Pronto regarding whether the patient would need to be taught how to administer lovenox injections due to TDWB status on right leg.  He stated the patient or family will need to be taught since the patient will likely be going home on injections.  RN already started teaching with patient this morning.  Will continue to provide instruction and reinforcement until patient and family are comfortable performing this task.  Brita Romp, RN

## 2017-07-08 NOTE — Care Management (Signed)
[facility] Individual Statement of Services  Patient Name:  Darrell Harrison.  Date:  07/08/2017  Welcome to the [facility].  Our goal is to provide you with an individualized program based on your diagnosis and situation, designed to meet your specific needs.  With this comprehensive rehabilitation program, you will be expected to participate in at least 3 hours of rehabilitation therapies Monday-Friday, with modified therapy programming on the weekends.  Your rehabilitation program will include the following services:  Physical Therapy (PT), Occupational Therapy (OT), 24 hour per day rehabilitation nursing, Therapeutic Recreaction (TR), Case Management (Social Worker), Rehabilitation Medicine, Nutrition Services and Pharmacy Services  Weekly team conferences will be held on Wednesdays to discuss your progress.  Your Social Worker will talk with you frequently to get your input and to update you on team discussions.  Team conferences with you and your family in attendance may also be held.  Expected length of stay: 8-10 days    Overall anticipated outcome: supervision  Depending on your progress and recovery, your program may change. Your Social Worker will coordinate services and will keep you informed of any changes. Your Social Worker's name and contact numbers are listed  below.  The following services may also be recommended but are not provided by the [facility] will be made to provide these services after discharge if needed.  Arrangements include referral to agencies that provide these services.  Your insurance has been verified to be:  [employer] and Medicaid  Your primary doctor is:  Dr. Legrand Rams  Pertinent information will be shared with your doctor and your insurance company.  Social Worker:   [place], [place] or [phone]   Information discussed with and copy given to patient by: Lennart Pall, 07/08/2017, 1:33 PM

## 2017-07-08 NOTE — Plan of Care (Signed)
  Problem: Consults Goal: RH GENERAL PATIENT EDUCATION Description See Patient Education module for education specifics. Outcome: Progressing   Problem: RH BOWEL ELIMINATION Goal: RH STG MANAGE BOWEL WITH ASSISTANCE Description STG Manage Bowel with min Assistance.  Outcome: Progressing Goal: RH OTHER STG BOWEL ELIMINATION GOALS W/ASSIST Description Other STG Bowel Elimination Goals With mod Assistance.  Outcome: Progressing   Problem: RH BLADDER ELIMINATION Goal: RH STG MANAGE BLADDER WITH ASSISTANCE Description STG Manage Bladder With mod Assistance  Outcome: Progressing   Problem: RH SKIN INTEGRITY Goal: RH STG SKIN FREE OF INFECTION/BREAKDOWN Description Patients skin will remain free from further infection or breakdown with min assist.  Outcome: Progressing Goal: RH STG MAINTAIN SKIN INTEGRITY WITH ASSISTANCE Description STG Maintain and assess for Skin Integrity every shift and document. Min assist  Outcome: Progressing   Problem: RH SAFETY Goal: RH STG DECREASED RISK OF FALL WITH ASSISTANCE Description STG Decreased Risk of Fall With mod I Assistance.  Outcome: Progressing Goal: RH OTHER STG SAFETY GOALS W/ASSIST Description Other STG Safety Goals With mod I Assistance.  Outcome: Progressing   Problem: RH PAIN MANAGEMENT Goal: RH STG PAIN MANAGED AT OR BELOW PT'S PAIN GOAL Description Maintain pain level of 3/10 or less  Outcome: Progressing Goal: RH OTHER STG PAIN MANAGEMENT GOALS W/ASSIST Description Other STG Pain Management Goals With Assistance. Outcome: Progressing   Problem: RH KNOWLEDGE DEFICIT GENERAL Goal: RH STG INCREASE KNOWLEDGE OF SELF CARE AFTER HOSPITALIZATION Outcome: Progressing

## 2017-07-09 ENCOUNTER — Inpatient Hospital Stay (HOSPITAL_COMMUNITY): Payer: Medicare HMO | Admitting: Occupational Therapy

## 2017-07-09 ENCOUNTER — Inpatient Hospital Stay (HOSPITAL_COMMUNITY): Payer: Medicare HMO | Admitting: Physical Therapy

## 2017-07-09 DIAGNOSIS — G479 Sleep disorder, unspecified: Secondary | ICD-10-CM

## 2017-07-09 MED ORDER — MELATONIN 3 MG PO TABS
1.5000 mg | ORAL_TABLET | Freq: Every day | ORAL | Status: DC
  Administered 2017-07-09 – 2017-07-11 (×3): 1.5 mg via ORAL
  Filled 2017-07-09 (×3): qty 0.5

## 2017-07-09 NOTE — Progress Notes (Signed)
Occupational Therapy Session Note  Patient Details  Name: Darrell Swann Jr. MRN: [id number] Date of Birth: 10/05/1945  Today's Date: 07/09/2017 OT Individual Time: 1105-1135 OT Individual Time Calculation (min): 30 min    Short Term Goals: Week 1:  OT Short Term Goal 1 (Week 1): STGs=LTGs due to ELOS  Skilled Therapeutic Interventions/Progress Updates:    Pt seen this session to facilitate RLE strength with AROM exercises. Pt propelled self to gym and used RW to transfer to mat with steadying A to close S.  He maintained WB status well on RLE.  Pt sat to EOM and moved into supine without A. Pt worked on sliding R leg in and out for hip and knee flexion and then small hip int/ext rotation movements.   He sat back to EOM and worked on active knee extension with ankle flexion/ ext to prevent edema and stiffness in that leg.  UE exercises with 5 lb dowel bar with sh presses, chest presses and bicep curls 12 reps x 2 sets. Pt transferred back to w/c and propelled back to room. Pt in room with all needs met.  Therapy Documentation Precautions:  Precautions Precautions: Posterior Hip, Fall Precaution Comments: Per chart review, MD states no sternal precautions Restrictions Weight Bearing Restrictions: Yes RLE Weight Bearing: Touchdown weight bearing  Pain: Pain Assessment Pain Score: 3  - R hip ADL: ADL ADL Comments: Please see functional navigator for ADL status  See Function Navigator for Current Functional Status.   Therapy/Group: Individual Therapy  SAGUIER,JULIA 07/09/2017, 11:32 AM 
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
## 2017-07-09 NOTE — Progress Notes (Signed)
Occupational Therapy Session Note  Patient Details  Name: Darrell Harrison. MRN: [id number] Date of Birth: 11/04/1945  Today's Date: 07/09/2017 OT Individual Time: 0935-1030 OT Individual Time Calculation (min): 55 min    Short Term Goals: Week 1:  OT Short Term Goal 1 (Week 1): STGs=LTGs due to ELOS  Skilled Therapeutic Interventions/Progress Updates:    Treatment session with focus on ADL retraining with functional transfers and use of AE for LB dressing to adhere to hip precautions.  Pt ambulated to room shower with RW with supervision with NWB (pt preference over TDWB).  Min assist during transfer to shower seat due to mild LOB and reports of lightheadedness.  Vitals assessed 129/80 and with rest pt reports lightheadedness subsiding.  Pt completed at overall supervision/setup level with use of long handled sponge to wash lower legs and feet.  Min guard during transfer out of shower.  Completed dressing with setup assist and use of reacher and sock aid to complete LB dressing.  Pt's brother present and observing during session, neither report any questions.  Therapy Documentation Precautions:  Precautions Precautions: Posterior Hip, Fall Precaution Comments: Per chart review, MD states no sternal precautions Restrictions Weight Bearing Restrictions: Yes RLE Weight Bearing: Touchdown weight bearing General:   Vital Signs: Therapy Vitals BP: (!) 155/82 Patient Position (if appropriate): Sitting Pain: Pain Assessment Pain Scale: 0-10 Pain Score: 3  Pain Type: Acute pain Pain Location: Sternum Pain Descriptors / Indicators: Sore Pain Onset: Gradual Pain Intervention(s): Medication (See eMAR)  See Function Navigator for Current Functional Status.   Therapy/Group: Individual Therapy  Simonne Come 07/09/2017, 10:58 AM

## 2017-07-09 NOTE — Progress Notes (Signed)
Physical Therapy Session Note  Patient Details  Name: Darrell Harrison. MRN: [id number] Date of Birth: 17-Apr-1945  Today's Date: 07/09/2017 PT Individual Time: 0805-0900 and 1305-1400 PT Individual Time Calculation (min): 55 min and 55 min  Short Term Goals: Week 1:  PT Short Term Goal 1 (Week 1): =LTGs due to ELOS  Skilled Therapeutic Interventions/Progress Updates: Tx1: Pt presented in bed agreeable to therapy. Pt c/o increased pain in sternum this am. Performed supine to sit via log roll min guard, pt indicated feeling less pain then when sat up for breakfast this am. Pt requesting to use toilet, performed sit to stand from EOB min guard and ambulated to toilet (+void/BM). Pt able to perform peri-care in standing with one UE on RW. Pt retturned to w/c and performed oral hygiene at sink. Propelled to rehab gym and performed squat pivot to mat supervision and participated in supine and seated therex. Performed SLR, supine hip abd/add, SAQ with 3# cuff on LLE and 2# cuff on RLE, hip ER with level 2 resistance band, seated LAQ 2 x 10 ea. Pt with several sneezes provided pt edu on using pillow splint for pain management during sneezes/coughs. Pt attempted sidelying hip ER however sidelying activities limited by pain. Brother arrived during therex and advised will be installing railings today or tomorrow. After brief rest pt ambulated to room min guard with good adherence of wt bearing remained in w/c at end of session with call bell within reach and brother present.   Tx2: Pt presented in w/c agreeable to therapy. Pt indicating decreased pain in sternum from am session. Pt requesting to use urinal. Pt attempting to stand up prior to leg rests removed, provided pt edu re: awareness and safety with surroundings prior to transfers, pt expressed understanding. Pt ambulate to rehab gym with RW and min guard. Performed stair training x 4 steps with bilateral rails, cues for decreasing speed with ascending  for safety. Performed gait training via obstacle course weaving through cones and stepping over threshold. Pt required min cues for safety with threshold. Performed side hopping with RW in preparation for entry to bathroom as pt's brother indicated in am session may be narrower then RW. Pt able to perform 49f x 2 L/R with min cues for decreasing speed for safety. Pt transported to day room and performed NuStep L2 x 5 min with RLE propped on stool. Pt returned to w/c and returned to room. Pt requesting to return to bed. Performed stand pivot min guard and performed sit to supine with min guard and use of features. Pt left in bed at end of session with call bell within reach and needs met.      Therapy Documentation Precautions:  Precautions Precautions: Posterior Hip, Fall Precaution Comments: Per chart review, MD states no sternal precautions Restrictions Weight Bearing Restrictions: Yes RLE Weight Bearing: Touchdown weight bearing General:   Vital Signs:  Pain: Pain Assessment Pain Scale: 0-10 Pain Score: 7  Pain Type: Acute pain Pain Location: Sternum Pain Descriptors / Indicators: Sore Pain Onset: Gradual Pain Intervention(s): Medication (See eMAR)   See Function Navigator for Current Functional Status.   Therapy/Group: Individual Therapy  Biran Mayberry  Amarah Brossman, PTA  07/09/2017, 12:24 PM

## 2017-07-10 ENCOUNTER — Inpatient Hospital Stay (HOSPITAL_COMMUNITY): Payer: Medicare HMO | Admitting: Occupational Therapy

## 2017-07-10 ENCOUNTER — Inpatient Hospital Stay (HOSPITAL_COMMUNITY): Payer: Medicare HMO | Admitting: Physical Therapy

## 2017-07-10 DIAGNOSIS — S32441D Displaced fracture of posterior column [ilioischial] of right acetabulum, subsequent encounter for fracture with routine healing: Secondary | ICD-10-CM

## 2017-07-10 DIAGNOSIS — I10 Essential (primary) hypertension: Secondary | ICD-10-CM

## 2017-07-10 DIAGNOSIS — G8918 Other acute postprocedural pain: Secondary | ICD-10-CM

## 2017-07-10 DIAGNOSIS — G479 Sleep disorder, unspecified: Secondary | ICD-10-CM

## 2017-07-10 NOTE — Progress Notes (Signed)
Physical Therapy Session Note  Patient Details  Name: Darrell Harrison. MRN: [id number] Date of Birth: Nov 25, 1945  Today's Date: 07/10/2017 PT Individual Time: 9702-6378 PT Individual Time Calculation (min): 34 min   Short Term Goals: Week 1:  PT Short Term Goal 1 (Week 1): =LTGs due to ELOS  Skilled Therapeutic Interventions/Progress Updates:    Pt up in w/c upon arrival and agreeable to PT session. Reports that rails are getting put on tomorrow morning at home. Sternum is sore with turning and reaching. Able to recall 3/3 hip precautions. Reinforced TTWB during session. Transfers: sit<>stand supervision, cues to reach back when sitting. Pt standing to use urinal prior to leaving room, close supervision for guarding balance. Consistent placement of LE for precautions. Ambulation: 120 ft X2 with rw and supervision, cues for TTWB. EX: LAQ, knee flexion - each 1X15 with manual resist, isometric hip add/abd each 1x15. Following session, pt returned to w/c in room with family present and all needs in reach.   Therapy Documentation Precautions:  Precautions Precautions: Posterior Hip, Fall Precaution Comments: Per chart review, MD states no sternal precautions Restrictions Weight Bearing Restrictions: Yes RLE Weight Bearing: Touchdown weight bearing    Pain: 7/10 in chest with turning trunk, minimal pain at rest. Monitored during session.  See Function Navigator for Current Functional Status.   Therapy/Group: Individual Therapy  [name], PT 07/10/2017, 1:44 PM

## 2017-07-10 NOTE — Progress Notes (Signed)
Occupational Therapy Session Note  Patient Details  Name: Darrell Harrison. MRN: [id number] Date of Birth: [birth year]  Today's Date: 07/10/2017 OT Individual Time: 1516-1600 OT Individual Time Calculation (min): 44 min    Short Term Goals: Week 1:  OT Short Term Goal 1 (Week 1): STGs=LTGs due to ELOS  Skilled Therapeutic Interventions/Progress Updates:    Treatment session with focus on functional mobility in community environment.  Pt propelled w/c to [facility] on/off elevators without assistance.  Engaged in mobility with RW through gift shop, navigating obstacles with supervision while maintaining TDWB through RLE.  Discussed community reintegration and energy conservation strategies with going out into the community.  Ambulated around tables and completed transfers to chair without arm rests and booth to simulate access to restaurant seating.  Pt completed all transfers and mobility at supervision level.  Pt's sister present throughout session and encouraged pt progress and ability to navigate community setting.  Pt returned to room with w/c as above and left upright in w/c with all needs in reach.  Therapy Documentation Precautions:  Precautions Precautions: Posterior Hip, Fall Precaution Comments: Per chart review, MD states no sternal precautions Restrictions Weight Bearing Restrictions: Yes RLE Weight Bearing: Touchdown weight bearing General:   Vital Signs: Therapy Vitals Temp: 98.6 F (37 C) Temp Source: Oral Pulse Rate: 75 Resp: 20 BP: 118/68 Patient Position (if appropriate): Sitting Oxygen Therapy SpO2: 96 % O2 Device: Room Air Pain:  Pt with c/o pain in ribs, premedicated.  See Function Navigator for Current Functional Status.   Therapy/Group: Individual Therapy  Simonne Come 07/10/2017, 4:10 PM
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
## 2017-07-10 NOTE — Progress Notes (Signed)
Occupational Therapy Session Note  Patient Details  Name: Darrell Harrison. MRN: [id number] Date of Birth: 11/16/1945  Today's Date: 07/10/2017 OT Individual Time: 1062-6948 OT Individual Time Calculation (min): 58 min    Short Term Goals: Week 1:  OT Short Term Goal 1 (Week 1): STGs=LTGs due to ELOS  Skilled Therapeutic Interventions/Progress Updates:    1:1 Self care retraining at shower level. Provided setup due to small room space- got out clothes and laid on bed. Pt able to demonstrate bathing and dressing with AE without needing external cues for precautions. With encouragement to do TDWB; pt ambulated to gym with distant supervision.  Participated in right LE strengthening and conditioning exercises in supine. With supine to sitting pt did experience lightheadedness but able to recover quickly. Engaged in game of horseshoes with focus on challenging balance with occasional UE support while maintaining precautions.  Also had to retreive horseshoes with reacher from floor to continue practicing with use of AE to receive objects.  Returned to room and left with brother in room.  Continued to discuss d/cplanning and safety in the home.   Therapy Documentation Precautions:  Precautions Precautions: Posterior Hip, Fall Precaution Comments: Per chart review, MD states no sternal precautions Restrictions Weight Bearing Restrictions: Yes RLE Weight Bearing: Touchdown weight bearing Pain: Chest discomfort : muscular- applied mild heat with rest break ; also encouraged TDWB instead of NWB to help  ADL: ADL ADL Comments: Please see functional navigator for ADL status Other Treatments:    See Function Navigator for Current Functional Status.   Therapy/Group: Individual Therapy  Willeen Cass [facility] 07/10/2017, 9:33 AM

## 2017-07-10 NOTE — Progress Notes (Signed)
Physical Therapy Session Note  Patient Details  Name: Darrell Harrison. MRN: [id number] Date of Birth: [birth year]  Today's Date: 07/10/2017 PT Individual Time: 1100-1200 PT Individual Time Calculation (min): 60 min   Short Term Goals: Week 1:  PT Short Term Goal 1 (Week 1): =LTGs due to ELOS  Skilled Therapeutic Interventions/Progress Updates: Pt presented in recliner with family present agreeable to therapy. Pt requesting to use urinal. Performed sit to stand with supervision for urinary void (+void). Pt propelled to elevators and [facility] entrance of hospital for community ambulation. Pt ambulated 147ft x 2 with RW and TDWB with transfers to bench and lounge chair with arms in lobby. Pt demonstrated no LOB and was able to maneuver doorways, thresholds with good safety. Pt transported to rehab unit and participated in Wii activities including boxing and bowling. Pt able to maintain standing balance with TDWB up to 9 min before increased fatigue. Pt propelled back to room and remained in w/c with family present and lunch tray set up.      Therapy Documentation Precautions:  Precautions Precautions: Posterior Hip, Fall Precaution Comments: Per chart review, MD states no sternal precautions Restrictions Weight Bearing Restrictions: Yes RLE Weight Bearing: Touchdown weight bearing General:   Vital Signs:   Pain: Pain Assessment Pain Scale: 0-10 Pain Score: 0-No pain Pain Type: Acute pain Pain Location: Hip Pain Orientation: Right Pain Descriptors / Indicators: Aching Pain Frequency: Intermittent Pain Onset: On-going Pain Intervention(s): Medication (See eMAR)   See Function Navigator for Current Functional Status.   Therapy/Group: Individual Therapy  Darrell Harrison  Darrell Harrison, PTA  07/10/2017, 12:26 PM

## 2017-07-11 ENCOUNTER — Inpatient Hospital Stay (HOSPITAL_COMMUNITY): Payer: Medicare HMO | Admitting: Occupational Therapy

## 2017-07-11 ENCOUNTER — Inpatient Hospital Stay (HOSPITAL_COMMUNITY): Payer: Medicare HMO

## 2017-07-11 ENCOUNTER — Inpatient Hospital Stay (HOSPITAL_COMMUNITY): Payer: Medicare HMO | Admitting: Physical Therapy

## 2017-07-11 MED ORDER — CAMPHOR-MENTHOL 0.5-0.5 % EX LOTN
TOPICAL_LOTION | CUTANEOUS | Status: DC | PRN
  Filled 2017-07-11: qty 222

## 2017-07-11 NOTE — Plan of Care (Signed)
Pt meeting goals.

## 2017-07-11 NOTE — Progress Notes (Signed)
Occupational Therapy Discharge Summary  Patient Details  Name: Darrell Harrison. MRN: [id number] Date of Birth: 1945-11-07   Patient has met 9 of 9 long term goals due to improved activity tolerance, improved balance, postural control, ability to compensate for deficits, functional use of  RIGHT upper, RIGHT lower, LEFT upper and LEFT lower extremity and improved coordination.  Pt made excellent progress with BADLs during this admission.  Pt completes bathing, dressing, and toileting tasks at setup/supervision level.  Pt recalls 3/3 hip precautions and uses AE appropriately for BADLs.  Pt states he will have family/friends with him periodically during each day. Patient to discharge at overall Supervision level.  Patient's care partner is independent to provide the necessary physical assistance at discharge.      Recommendation:  Patient will benefit from ongoing skilled OT services in home health setting to continue to advance functional skills in the area of BADL and Reduce care partner burden.  Equipment: BSC, tub transfer bench  Reasons for discharge: treatment goals met and discharge from hospital  Patient/family agrees with progress made and goals achieved: Yes  OT Discharge Vision Baseline Vision/History: No visual deficits Patient Visual Report: No change from baseline Vision Assessment?: No apparent visual deficits Perception  Perception: Within Functional Limits Praxis Praxis: Intact Cognition Overall Cognitive Status: Within Functional Limits for tasks assessed Arousal/Alertness: Awake/alert Orientation Level: Oriented X4 Attention: Selective Sustained Attention: Appears intact Selective Attention: Appears intact Memory: Appears intact Awareness: Appears intact Problem Solving: Appears intact Safety/Judgment: Appears intact Sensation Sensation Light Touch: Appears Intact Hot/Cold: Appears Intact Proprioception: Appears Intact Stereognosis: Appears  Intact Coordination Gross Motor Movements are Fluid and Coordinated: Yes Fine Motor Movements are Fluid and Coordinated: Yes Coordination and Movement Description: Affected by pain and precaution restrictions Motor  Motor Motor: Within Functional Limits Motor - Skilled Clinical Observations: generalized weakness Mobility  Bed Mobility Bed Mobility: Rolling Right;Rolling Left;Supine to Sit;Sit to Supine Rolling Right: Independent Rolling Left: Independent Supine to Sit: Independent Sit to Supine: Independent Transfers Sit to Stand: Independent with assistive device Stand to Sit: Independent with assistive device  Trunk/Postural Assessment  Cervical Assessment Cervical Assessment: Within Functional Limits Thoracic Assessment Thoracic Assessment: Within Functional Limits Lumbar Assessment Lumbar Assessment: Within Functional Limits Postural Control Postural Control: Within Functional Limits  Balance Balance Balance Assessed: Yes Dynamic Sitting Balance Dynamic Sitting - Balance Support: No upper extremity supported;Feet supported Dynamic Sitting - Level of Assistance: 7: Independent Dynamic Sitting - Balance Activities: Reaching for objects;Reaching across midline Static Standing Balance Static Standing - Balance Support: Bilateral upper extremity supported;During functional activity Static Standing - Level of Assistance: 6: Modified independent (Device/Increase time) Dynamic Standing Balance Dynamic Standing - Balance Support: Bilateral upper extremity supported;During functional activity Dynamic Standing - Level of Assistance: 6: Modified independent (Device/Increase time) Dynamic Standing - Balance Activities: Reaching for objects Extremity/Trunk Assessment RUE Assessment RUE Assessment: Within Functional Limits LUE Assessment LUE Assessment: Within Functional Limits   See Function Navigator for Current Functional Status.  Leotis Shames [facility] 07/11/2017, 12:13  PM

## 2017-07-11 NOTE — Progress Notes (Signed)
Physical Therapy Session Note  Patient Details  Name: Darrell Harrison. MRN: [id number] Date of Birth: 04-28-1945  Today's Date: 07/11/2017 PT Individual Time: 1500-1545 PT Individual Time Calculation (min): 45 min   Short Term Goals: Week 1:  PT Short Term Goal 1 (Week 1): =LTGs due to ELOS  Skilled Therapeutic Interventions/Progress Updates:   Pt received sitting in recliner and agreeable to PT  Sit<>stand and stand pivot Transfers completed x 3 each with distant supervision assist from PT with cues for set up.   Gait training with RW on various surfaces including over level floor in rehab unit x 155f and over unlevel surface of cement side walk at hospital entrance 77fx 2. Min cues for proper WB through the RLE.   WC mobility through hall of rehab unit x 15057fith cues for WC parts management to place and remove leg rests pre and post WC mobility.   Patient returned to room and left sitting in recliner with call bell in reach and all needs met.        Therapy Documentation Precautions:  Precautions Precautions: Posterior Hip, Fall Precaution Comments: Per chart review, MD states no sternal precautions Restrictions Weight Bearing Restrictions: Yes RLE Weight Bearing: Touchdown weight bearing Vital Signs: Therapy Vitals Pulse Rate: 66 BP: 125/89 Patient Position (if appropriate): Sitting Oxygen Therapy SpO2: 99 % O2 Device: Room Air Pain: Denies.   See Function Navigator for Current Functional Status.   Therapy/Group: Individual Therapy  AusLorie Phenix13/2019, 5:45 PM
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
## 2017-07-11 NOTE — Progress Notes (Signed)
Physical Therapy Discharge Summary  Patient Details  Name: Darrell Harrison. MRN: [id number] Date of Birth: 19-May-1945  Today's Date: 07/11/2017 PT Individual Time: 1000-1100 PT Individual Time Calculation (min): 60 min    Patient has met 8 of 8 long term goals due to improved activity tolerance, improved balance, increased strength, decreased pain and ability to compensate for deficits.  Patient to discharge at an ambulatory level Supervision for mobility in the home and at a wheelchair level Mod I for community mobility.   Patient's care partner is independent to provide the necessary Supervision with verbal cueing for safety assistance at discharge.  Reasons goals not met: All PT goals met.   Recommendation:  Patient will benefit from ongoing skilled PT services in home health setting to continue to advance safe functional mobility, address ongoing impairments in balance, adherence to [facility] precautions, independence with functional mobility, and minimize fall risk.  Equipment: RW, manual 18x18 wheelchair with R elevating leg rest  Reasons for discharge: treatment goals met and discharge from hospital  Patient/family agrees with progress made and goals achieved: Yes  Skilled Intervention: Pt received seated in recliner in room, agreeable to PT. Reports some soreness in sternal area due to broken ribs, pain is not rated. Standing balance while using urinal, v/c to remember to use RW when standing to assist with adhering to TDWBing on RLE. Manual w/c propulsion x 150 ft with BUE Mod I, some v/c needed for management of w/c parts. Ambulation x 150 ft with RW and Supervision, v/c for gait pattern and to use BUE to offload RLE during gait to adhere to [facility] precautions. Car transfer with RW and Supervision, v/c for safe transfer technique. Ascend/descend 4 stairs with 2 handrails and Supervision, hopping on LLE. Bed mobility and bed to/from chair transfers Mod I with RW. Supine and seated BLE  therex x 10-15 reps. Pt's family to provide Supervision level assist for car transfer, stairs, and ambulation with RW once pt is home. Pt left seated in w/c in room with needs in reach and chair alarm in place.  PT Discharge Precautions/Restrictions Precautions Precautions: Posterior Hip;Fall Restrictions Weight Bearing Restrictions: Yes RLE Weight Bearing: Touchdown weight bearing Cognition Orientation Level: Oriented X4 Sensation Sensation Light Touch: Appears Intact Proprioception: Appears Intact Coordination Gross Motor Movements are Fluid and Coordinated: No Coordination and Movement Description: Affected by pain and precaution restrictions Motor  Motor Motor: Within Functional Limits Motor - Skilled Clinical Observations: generalized weakness  Mobility Bed Mobility Bed Mobility: Rolling Right;Rolling Left;Supine to Sit;Sit to Supine Rolling Right: Independent Rolling Left: Independent Supine to Sit: Independent Sit to Supine: Independent Transfers Transfers: Transfer Sit to Stand: Independent with assistive device Stand to Sit: Independent with assistive device Stand Pivot Transfers: Independent with assistive device Transfer (Assistive device): Rolling walker Locomotion  Gait Ambulation: Yes Gait Assistance: Independent with assistive device Gait Distance (Feet): 150 Feet Assistive device: Rolling walker Gait Gait: Yes Gait Pattern: Impaired Gait Pattern: Step-to pattern;Antalgic Gait velocity: decreased Stairs / Additional Locomotion Stairs: Yes Stairs Assistance: Supervision/Verbal cueing Stair Management Technique: Two rails Number of Stairs: 4 Height of Stairs: 6 Wheelchair Mobility Wheelchair Mobility: Yes Wheelchair Assistance: Independent with Camera operator: Both upper extremities Wheelchair Parts Management: Supervision/cueing Distance: 150'  Trunk/Postural Assessment  Cervical Assessment Cervical Assessment: Within  Functional Limits Thoracic Assessment Thoracic Assessment: Within Functional Limits Lumbar Assessment Lumbar Assessment: Within Functional Limits Postural Control Postural Control: Within Functional Limits  Balance Balance Balance Assessed: Yes Dynamic Sitting Balance Dynamic Sitting -  Balance Support: No upper extremity supported;Feet supported Dynamic Sitting - Level of Assistance: 7: Independent Dynamic Sitting - Balance Activities: Reaching for objects;Reaching across midline Static Standing Balance Static Standing - Balance Support: Bilateral upper extremity supported;During functional activity Static Standing - Level of Assistance: 6: Modified independent (Device/Increase time) Dynamic Standing Balance Dynamic Standing - Balance Support: Bilateral upper extremity supported;During functional activity Dynamic Standing - Level of Assistance: 6: Modified independent (Device/Increase time) Dynamic Standing - Balance Activities: Reaching for objects Extremity Assessment   RLE Assessment RLE Assessment: Exceptions to [facility] Passive Range of Motion (PROM) Comments: Limited 2/2 posterior hip precautions, otherwise knee and ankle WNL Active Range of Motion (AROM) Comments: Limited 2/2 posterior hip precautions, otherwise knee and ankle WNL General Strength Comments: 5/5 knee, ankle (hip deferred 2/2 precautions) LLE Assessment LLE Assessment: Within Functional Limits Passive Range of Motion (PROM) Comments: WFL Active Range of Motion (AROM) Comments: WFL General Strength Comments: 5/5 globally   See Function Navigator for Current Functional Status.   Excell Seltzer, PT, DPT 07/11/2017, 12:00 PM
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
## 2017-07-11 NOTE — Progress Notes (Signed)
Sutures removed from right hip. No drainage noted, pt tolerated well, will continue to observe.

## 2017-07-11 NOTE — Discharge Instructions (Signed)
Inpatient Rehab Discharge Instructions  Darrell Harrison. Discharge date and time: 07/12/17   Activities/Precautions/ Functional Status: Activity: no lifting, driving, or strenuous exercise  till cleared by MD Diet: regular diet Wound Care: keep wound clean and dry.  Contact MD if you develop any problems with your incision/wound--redness, swelling, increase in pain, drainage or if you develop fever or chills.    Functional status:  ___ No restrictions     ___ Walk up steps independently _X__ 24/7 supervision/assistance   ___ Walk up steps with assistance ___ Intermittent supervision/assistance  ___ Bathe/dress independently ___ Walk with walker     _X__ Bathe/dress with assistance ___ Walk Independently    ___ Shower independently _X__ Walk with supervision    ___ Shower with assistance ___ No alcohol     ___ Return to work/school ________  Special Instructions: 1. Touch down weight on right leg. Maintain total hip precautions till cleared by Dr. Marcelino Scot.   COMMUNITY REFERRALS UPON DISCHARGE:    Home Health:   PT     OT                       Agency:  [facility] Phone: [phone]   Medical Equipment/Items Ordered: wheelchair, cushion, walker, commode and tub bench                                                      Agency/Supplier:  [facility] [phone]    My questions have been answered and I understand these instructions. I will adhere to these goals and the provided educational materials after my discharge from the hospital.  Patient/Caregiver Signature _______________________________ Date __________  Clinician Signature _______________________________________ Date __________  Please bring this form and your medication list with you to all your follow-up doctor's appointments.

## 2017-07-11 NOTE — Progress Notes (Signed)
Social Work Patient ID: Darrell Dakins., male   DOB: May 19, 1945, 72 y.o.   MRN: [id number]  Met with pt and sisters yesterday afternoon to review team conference.  All pleased and agreeable with targeted d/c date of 6/14 and supervision goals. Discussed DME and HH follow up - arranging.  No further concerns.  Darrell Seaborn, LCSW

## 2017-07-11 NOTE — Progress Notes (Signed)
Occupational Therapy Session Note  Patient Details  Name: Gavino Fouch. MRN: [id number] Date of Birth: [birth year]  Today's Date: 07/11/2017 OT Individual Time: 1430-1500 OT Individual Time Calculation (min): 30 min    Short Term Goals: Week 1:  OT Short Term Goal 1 (Week 1): STGs=LTGs due to ELOS Week 2:     Skilled Therapeutic Interventions/Progress Updates:    1:1 Pt able to demonstrate sit to stands mod I and ability to maintain balance without UE support to void into urinal while maintaining weight bearing precautions. Pt performed functional ambulation with RW to and from the gym with distant supervision. In the gym perform UB exercise program for cardiopulmonary endurance and for strengthening since relaying on UB more due to weight bearing status. REeurned to room and into recliner. Pt's sister present for session.   Therapy Documentation Precautions:  Precautions Precautions: Posterior Hip, Fall Precaution Comments: Per chart review, MD states no sternal precautions Restrictions Weight Bearing Restrictions: Yes RLE Weight Bearing: Touchdown weight bearing Pain:  no c/o pain in session  See Function Navigator for Current Functional Status.   Therapy/Group: Individual Therapy  Willeen Cass [facility] 07/11/2017, 3:06 PM

## 2017-07-11 NOTE — Progress Notes (Signed)
Occupational Therapy Session Note  Patient Details  Name: Fardeen Steinberger. MRN: [id number] Date of Birth: May 22, 1945  Today's Date: 07/11/2017 OT Individual Time: 0900-1000 OT Individual Time Calculation (min): 60 min    Short Term Goals: Week 1:  OT Short Term Goal 1 (Week 1): STGs=LTGs due to ELOS  Skilled Therapeutic Interventions/Progress Updates:    OT intervention with focus on BADL retraining including toileting, bathing at shower level, dressing with sit<>stand from chair, functional amb with RW, functional transfers, and safety awareness to increase independence with BADLs. Pt recalled 3/3 hip precautions.  Pt amb with RW to bathroom to use toilet and transfer to shower.  Pt completed all bathing/dressing tasks without assistance using AE appropriately.  Pt completed grooming tasks seated in w/c at sink.  Pt remained in w/c with chair alarm activated and all needs within reach.   Therapy Documentation Precautions:  Precautions Precautions: Posterior Hip, Fall Precaution Comments: Per chart review, MD states no sternal precautions Restrictions Weight Bearing Restrictions: Yes RLE Weight Bearing: Touchdown weight bearing Pain: Pain Assessment Pain Scale: 0-10 Pain Score: 0-No pain Pain Type: Acute pain Pain Location: Sternum Pain Orientation: Medial Pain Descriptors / Indicators: Discomfort Pain Frequency: Intermittent Pain Onset: Gradual Pain Intervention(s): Medication (See eMAR)  See Function Navigator for Current Functional Status.   Therapy/Group: Individual Therapy  Leroy Libman 07/11/2017, 12:08 PM

## 2017-07-12 ENCOUNTER — Inpatient Hospital Stay (HOSPITAL_COMMUNITY): Payer: Medicare HMO | Admitting: Physical Therapy

## 2017-07-12 ENCOUNTER — Inpatient Hospital Stay (HOSPITAL_COMMUNITY): Payer: Medicare HMO

## 2017-07-12 DIAGNOSIS — T148XXA Other injury of unspecified body region, initial encounter: Secondary | ICD-10-CM

## 2017-07-12 LAB — CREATININE, SERUM: Creatinine, Ser: 1.04 mg/dL (ref 0.61–1.24)

## 2017-07-12 MED ORDER — TAMSULOSIN HCL 0.4 MG PO CAPS: 0.4000 mg | ORAL_CAPSULE | Freq: Every day | ORAL | 0 refills | Status: AC

## 2017-07-12 MED ORDER — LOSARTAN POTASSIUM 50 MG PO TABS: 50.0000 mg | ORAL_TABLET | Freq: Every day | ORAL | 0 refills | Status: AC

## 2017-07-12 MED ORDER — ENOXAPARIN SODIUM 40 MG/0.4ML ~~LOC~~ SOLN: 40.0000 mg | SUBCUTANEOUS | 0 refills | Status: DC

## 2017-07-12 MED ORDER — AMLODIPINE BESYLATE 10 MG PO TABS: 10.0000 mg | ORAL_TABLET | Freq: Every day | ORAL | 0 refills | Status: AC

## 2017-07-12 MED ORDER — CAMPHOR-MENTHOL 0.5-0.5 % EX LOTN: TOPICAL_LOTION | CUTANEOUS | 0 refills | Status: DC | PRN

## 2017-07-12 MED ORDER — TRAMADOL HCL 50 MG PO TABS: 50.0000 mg | ORAL_TABLET | Freq: Four times a day (QID) | ORAL | Status: DC | PRN

## 2017-07-12 MED ORDER — ACETAMINOPHEN 325 MG PO TABS: 325.0000 mg | ORAL_TABLET | ORAL | Status: DC | PRN

## 2017-07-12 MED ORDER — METHOCARBAMOL 750 MG PO TABS: 750.0000 mg | ORAL_TABLET | Freq: Four times a day (QID) | ORAL | 0 refills | Status: DC | PRN

## 2017-07-12 MED ORDER — TRAMADOL HCL 50 MG PO TABS: 50.0000 mg | ORAL_TABLET | Freq: Four times a day (QID) | ORAL | 0 refills | Status: DC | PRN

## 2017-07-12 MED ORDER — MELATONIN 3 MG PO TABS: 1.5000 mg | ORAL_TABLET | Freq: Every day | ORAL | 0 refills | Status: DC

## 2017-07-12 NOTE — Progress Notes (Signed)
Lovenox instructions provided, pt verbalized an understanding, pt did return demo and performed injection with no direction.

## 2017-07-12 NOTE — Discharge Summary (Signed)
Discharge summary job # [id number]

## 2017-07-12 NOTE — Progress Notes (Signed)
Social Work Discharge Note  The overall goal for the admission was met for:   Discharge location: Yes - home with family to provide all needed assistance.  Brother to be primary support person.  Length of Stay: Yes - 7 days  Discharge activity level: Yes - supervision overall  Home/community participation: Yes  Services provided included: MD, RD, PT, OT, RN, TR, Pharmacy and SW  Financial Services: Medicare and Medicaid  Follow-up services arranged: Home Health: PT, OT via [facility], DME: 18x18 lightweight w/c with ELRs, cushion, rolling walker, 3n1 commode and tub bench via [facility] and Patient/Family has no preference for HH/DME agencies  Comments (or additional information):  Patient/Family verbalized understanding of follow-up arrangements: Yes  Individual responsible for coordination of the follow-up plan: pt  Confirmed correct DME delivered: Venisha Boehning 07/12/2017    Amor Hyle
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
## 2017-07-12 NOTE — Progress Notes (Signed)
Pt d/c with personal belongings, pt verbalized understanding of discharge instructions, self admin lovenox, pt verbalized weight bearing status instructions and s/s of infections.
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
## 2017-07-14 DIAGNOSIS — S2220XD Unspecified fracture of sternum, subsequent encounter for fracture with routine healing: Secondary | ICD-10-CM | POA: Diagnosis not present

## 2017-07-14 DIAGNOSIS — Z7901 Long term (current) use of anticoagulants: Secondary | ICD-10-CM | POA: Diagnosis not present

## 2017-07-14 DIAGNOSIS — I10 Essential (primary) hypertension: Secondary | ICD-10-CM | POA: Diagnosis not present

## 2017-07-14 DIAGNOSIS — S2241XD Multiple fractures of ribs, right side, subsequent encounter for fracture with routine healing: Secondary | ICD-10-CM | POA: Diagnosis not present

## 2017-07-14 DIAGNOSIS — Z8546 Personal history of malignant neoplasm of prostate: Secondary | ICD-10-CM | POA: Diagnosis not present

## 2017-07-14 DIAGNOSIS — S36892D Contusion of other intra-abdominal organs, subsequent encounter: Secondary | ICD-10-CM | POA: Diagnosis not present

## 2017-07-14 DIAGNOSIS — S32401D Unspecified fracture of right acetabulum, subsequent encounter for fracture with routine healing: Secondary | ICD-10-CM | POA: Diagnosis not present

## 2017-07-16 ENCOUNTER — Telehealth: Payer: Self-pay

## 2017-07-16 NOTE — Telephone Encounter (Signed)
Transitional Care call  Patient name: Darrell Harrison  DOB: [birth year] 1. Are you/is patient experiencing any problems since coming home? No a. Are there any questions regarding any aspect of care? No 2. Are there any questions regarding medications administration/dosing? No a. Are meds being taken as prescribed? Yes, Medication List reviewed. b. "Patient should review meds with caller to confirm"  3. Have there been any falls? No 4. Has Home Health been to the house and/or have they contacted you? Yes, [facility] a. If not, have you tried to contact them? NA b. Can we help you contact them? NA 5. Are bowels and bladder emptying properly? Yes a. Are there any unexpected incontinence issues? No b. If applicable, is patient following bowel/bladder programs? NA 6. Any fevers, problems with breathing, unexpected pain? No 7. Are there any skin problems or new areas of breakdown? No 8. Has the patient/family member arranged specialty MD follow up (ie cardiology/neurology/renal/surgical/etc.)?  Yes a. Can we help arrange? NA 9. Does the patient need any other services or support that we can help arrange? NA 10. Are caregivers following through as expected in assisting the patient? Yes 11. Has the patient quit smoking, drinking alcohol, or using drugs as recommended? [name] denies smoking, drinking alcohol or using illicit drugs.   Appointment date/time 07/24/2017, arrival time 12:40 for 1:00 appointment with Danella Sensing ANP-C. At [facility]

## 2017-07-17 DIAGNOSIS — Z6829 Body mass index (BMI) 29.0-29.9, adult: Secondary | ICD-10-CM | POA: Diagnosis not present

## 2017-07-17 DIAGNOSIS — Z7901 Long term (current) use of anticoagulants: Secondary | ICD-10-CM | POA: Diagnosis not present

## 2017-07-17 DIAGNOSIS — M25551 Pain in right hip: Secondary | ICD-10-CM | POA: Diagnosis not present

## 2017-07-17 DIAGNOSIS — N400 Enlarged prostate without lower urinary tract symptoms: Secondary | ICD-10-CM | POA: Diagnosis not present

## 2017-07-17 DIAGNOSIS — Z8546 Personal history of malignant neoplasm of prostate: Secondary | ICD-10-CM | POA: Diagnosis not present

## 2017-07-17 DIAGNOSIS — S32401D Unspecified fracture of right acetabulum, subsequent encounter for fracture with routine healing: Secondary | ICD-10-CM | POA: Diagnosis not present

## 2017-07-17 DIAGNOSIS — S2220XD Unspecified fracture of sternum, subsequent encounter for fracture with routine healing: Secondary | ICD-10-CM | POA: Diagnosis not present

## 2017-07-17 DIAGNOSIS — S36892D Contusion of other intra-abdominal organs, subsequent encounter: Secondary | ICD-10-CM | POA: Diagnosis not present

## 2017-07-17 DIAGNOSIS — G4700 Insomnia, unspecified: Secondary | ICD-10-CM | POA: Diagnosis not present

## 2017-07-17 DIAGNOSIS — S2241XD Multiple fractures of ribs, right side, subsequent encounter for fracture with routine healing: Secondary | ICD-10-CM | POA: Diagnosis not present

## 2017-07-17 DIAGNOSIS — I10 Essential (primary) hypertension: Secondary | ICD-10-CM | POA: Diagnosis not present

## 2017-07-18 DIAGNOSIS — S36892D Contusion of other intra-abdominal organs, subsequent encounter: Secondary | ICD-10-CM | POA: Diagnosis not present

## 2017-07-18 DIAGNOSIS — I10 Essential (primary) hypertension: Secondary | ICD-10-CM | POA: Diagnosis not present

## 2017-07-18 DIAGNOSIS — Z8546 Personal history of malignant neoplasm of prostate: Secondary | ICD-10-CM | POA: Diagnosis not present

## 2017-07-18 DIAGNOSIS — S2241XD Multiple fractures of ribs, right side, subsequent encounter for fracture with routine healing: Secondary | ICD-10-CM | POA: Diagnosis not present

## 2017-07-18 DIAGNOSIS — S32401D Unspecified fracture of right acetabulum, subsequent encounter for fracture with routine healing: Secondary | ICD-10-CM | POA: Diagnosis not present

## 2017-07-18 DIAGNOSIS — Z7901 Long term (current) use of anticoagulants: Secondary | ICD-10-CM | POA: Diagnosis not present

## 2017-07-18 DIAGNOSIS — S2220XD Unspecified fracture of sternum, subsequent encounter for fracture with routine healing: Secondary | ICD-10-CM | POA: Diagnosis not present

## 2017-07-19 DIAGNOSIS — I10 Essential (primary) hypertension: Secondary | ICD-10-CM | POA: Diagnosis not present

## 2017-07-19 DIAGNOSIS — Z7901 Long term (current) use of anticoagulants: Secondary | ICD-10-CM | POA: Diagnosis not present

## 2017-07-19 DIAGNOSIS — S32401D Unspecified fracture of right acetabulum, subsequent encounter for fracture with routine healing: Secondary | ICD-10-CM | POA: Diagnosis not present

## 2017-07-19 DIAGNOSIS — S2241XD Multiple fractures of ribs, right side, subsequent encounter for fracture with routine healing: Secondary | ICD-10-CM | POA: Diagnosis not present

## 2017-07-19 DIAGNOSIS — Z8546 Personal history of malignant neoplasm of prostate: Secondary | ICD-10-CM | POA: Diagnosis not present

## 2017-07-19 DIAGNOSIS — S32401S Unspecified fracture of right acetabulum, sequela: Secondary | ICD-10-CM | POA: Diagnosis not present

## 2017-07-19 DIAGNOSIS — S36892D Contusion of other intra-abdominal organs, subsequent encounter: Secondary | ICD-10-CM | POA: Diagnosis not present

## 2017-07-19 DIAGNOSIS — S2241XS Multiple fractures of ribs, right side, sequela: Secondary | ICD-10-CM | POA: Diagnosis not present

## 2017-07-19 DIAGNOSIS — S2220XD Unspecified fracture of sternum, subsequent encounter for fracture with routine healing: Secondary | ICD-10-CM | POA: Diagnosis not present

## 2017-07-22 DIAGNOSIS — S2220XD Unspecified fracture of sternum, subsequent encounter for fracture with routine healing: Secondary | ICD-10-CM | POA: Diagnosis not present

## 2017-07-22 DIAGNOSIS — I10 Essential (primary) hypertension: Secondary | ICD-10-CM | POA: Diagnosis not present

## 2017-07-22 DIAGNOSIS — S36892D Contusion of other intra-abdominal organs, subsequent encounter: Secondary | ICD-10-CM | POA: Diagnosis not present

## 2017-07-22 DIAGNOSIS — Z7901 Long term (current) use of anticoagulants: Secondary | ICD-10-CM | POA: Diagnosis not present

## 2017-07-22 DIAGNOSIS — S32401D Unspecified fracture of right acetabulum, subsequent encounter for fracture with routine healing: Secondary | ICD-10-CM | POA: Diagnosis not present

## 2017-07-22 DIAGNOSIS — S2241XD Multiple fractures of ribs, right side, subsequent encounter for fracture with routine healing: Secondary | ICD-10-CM | POA: Diagnosis not present

## 2017-07-22 DIAGNOSIS — Z8546 Personal history of malignant neoplasm of prostate: Secondary | ICD-10-CM | POA: Diagnosis not present

## 2017-07-24 ENCOUNTER — Encounter: Payer: Self-pay | Admitting: Registered Nurse

## 2017-07-24 ENCOUNTER — Encounter: Payer: Medicare HMO | Attending: Registered Nurse | Admitting: Registered Nurse

## 2017-07-24 VITALS — BP 151/88 | HR 74 | Resp 14 | Ht 69.0 in | Wt 191.0 lb

## 2017-07-24 DIAGNOSIS — T148XXA Other injury of unspecified body region, initial encounter: Secondary | ICD-10-CM | POA: Diagnosis not present

## 2017-07-24 DIAGNOSIS — S2222XA Fracture of body of sternum, initial encounter for closed fracture: Secondary | ICD-10-CM | POA: Diagnosis not present

## 2017-07-24 DIAGNOSIS — G479 Sleep disorder, unspecified: Secondary | ICD-10-CM

## 2017-07-24 DIAGNOSIS — Z8546 Personal history of malignant neoplasm of prostate: Secondary | ICD-10-CM | POA: Diagnosis not present

## 2017-07-24 DIAGNOSIS — S2220XA Unspecified fracture of sternum, initial encounter for closed fracture: Secondary | ICD-10-CM | POA: Diagnosis not present

## 2017-07-24 DIAGNOSIS — S2241XA Multiple fractures of ribs, right side, initial encounter for closed fracture: Secondary | ICD-10-CM | POA: Diagnosis not present

## 2017-07-24 DIAGNOSIS — C61 Malignant neoplasm of prostate: Secondary | ICD-10-CM

## 2017-07-24 DIAGNOSIS — M62838 Other muscle spasm: Secondary | ICD-10-CM | POA: Insufficient documentation

## 2017-07-24 DIAGNOSIS — Z9889 Other specified postprocedural states: Secondary | ICD-10-CM | POA: Diagnosis not present

## 2017-07-24 DIAGNOSIS — R05 Cough: Secondary | ICD-10-CM | POA: Insufficient documentation

## 2017-07-24 DIAGNOSIS — S32441S Displaced fracture of posterior column [ilioischial] of right acetabulum, sequela: Secondary | ICD-10-CM | POA: Diagnosis not present

## 2017-07-24 DIAGNOSIS — I10 Essential (primary) hypertension: Secondary | ICD-10-CM | POA: Diagnosis not present

## 2017-07-24 DIAGNOSIS — S32401A Unspecified fracture of right acetabulum, initial encounter for closed fracture: Secondary | ICD-10-CM | POA: Diagnosis not present
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
## 2017-07-25 DIAGNOSIS — Z8546 Personal history of malignant neoplasm of prostate: Secondary | ICD-10-CM | POA: Diagnosis not present

## 2017-07-25 DIAGNOSIS — S32401D Unspecified fracture of right acetabulum, subsequent encounter for fracture with routine healing: Secondary | ICD-10-CM | POA: Diagnosis not present

## 2017-07-25 DIAGNOSIS — S2220XD Unspecified fracture of sternum, subsequent encounter for fracture with routine healing: Secondary | ICD-10-CM | POA: Diagnosis not present

## 2017-07-25 DIAGNOSIS — Z7901 Long term (current) use of anticoagulants: Secondary | ICD-10-CM | POA: Diagnosis not present

## 2017-07-25 DIAGNOSIS — S36892D Contusion of other intra-abdominal organs, subsequent encounter: Secondary | ICD-10-CM | POA: Diagnosis not present

## 2017-07-25 DIAGNOSIS — S2241XD Multiple fractures of ribs, right side, subsequent encounter for fracture with routine healing: Secondary | ICD-10-CM | POA: Diagnosis not present

## 2017-07-25 DIAGNOSIS — I10 Essential (primary) hypertension: Secondary | ICD-10-CM | POA: Diagnosis not present

## 2017-07-29 DIAGNOSIS — I10 Essential (primary) hypertension: Secondary | ICD-10-CM | POA: Diagnosis not present

## 2017-07-29 DIAGNOSIS — S2241XA Multiple fractures of ribs, right side, initial encounter for closed fracture: Secondary | ICD-10-CM | POA: Diagnosis not present

## 2017-07-29 DIAGNOSIS — S32421D Displaced fracture of posterior wall of right acetabulum, subsequent encounter for fracture with routine healing: Secondary | ICD-10-CM | POA: Diagnosis not present

## 2017-07-29 DIAGNOSIS — S73014D Posterior dislocation of right hip, subsequent encounter: Secondary | ICD-10-CM | POA: Diagnosis not present

## 2017-07-30 DIAGNOSIS — I10 Essential (primary) hypertension: Secondary | ICD-10-CM | POA: Diagnosis not present

## 2017-07-30 DIAGNOSIS — S2220XD Unspecified fracture of sternum, subsequent encounter for fracture with routine healing: Secondary | ICD-10-CM | POA: Diagnosis not present

## 2017-07-30 DIAGNOSIS — Z8546 Personal history of malignant neoplasm of prostate: Secondary | ICD-10-CM | POA: Diagnosis not present

## 2017-07-30 DIAGNOSIS — S32401D Unspecified fracture of right acetabulum, subsequent encounter for fracture with routine healing: Secondary | ICD-10-CM | POA: Diagnosis not present

## 2017-07-30 DIAGNOSIS — S36892D Contusion of other intra-abdominal organs, subsequent encounter: Secondary | ICD-10-CM | POA: Diagnosis not present

## 2017-07-30 DIAGNOSIS — S2241XD Multiple fractures of ribs, right side, subsequent encounter for fracture with routine healing: Secondary | ICD-10-CM | POA: Diagnosis not present

## 2017-07-30 DIAGNOSIS — Z7901 Long term (current) use of anticoagulants: Secondary | ICD-10-CM | POA: Diagnosis not present

## 2017-07-31 DIAGNOSIS — Z7901 Long term (current) use of anticoagulants: Secondary | ICD-10-CM | POA: Diagnosis not present

## 2017-07-31 DIAGNOSIS — S32401D Unspecified fracture of right acetabulum, subsequent encounter for fracture with routine healing: Secondary | ICD-10-CM | POA: Diagnosis not present

## 2017-07-31 DIAGNOSIS — Z8546 Personal history of malignant neoplasm of prostate: Secondary | ICD-10-CM | POA: Diagnosis not present

## 2017-07-31 DIAGNOSIS — S2220XD Unspecified fracture of sternum, subsequent encounter for fracture with routine healing: Secondary | ICD-10-CM | POA: Diagnosis not present

## 2017-07-31 DIAGNOSIS — I10 Essential (primary) hypertension: Secondary | ICD-10-CM | POA: Diagnosis not present

## 2017-07-31 DIAGNOSIS — S36892D Contusion of other intra-abdominal organs, subsequent encounter: Secondary | ICD-10-CM | POA: Diagnosis not present

## 2017-07-31 DIAGNOSIS — S2241XD Multiple fractures of ribs, right side, subsequent encounter for fracture with routine healing: Secondary | ICD-10-CM | POA: Diagnosis not present

## 2017-08-08 ENCOUNTER — Other Ambulatory Visit: Payer: Self-pay | Admitting: Physical Medicine and Rehabilitation

## 2017-08-10 DIAGNOSIS — S73014A Posterior dislocation of right hip, initial encounter: Secondary | ICD-10-CM | POA: Diagnosis not present

## 2017-08-28 DIAGNOSIS — S32421D Displaced fracture of posterior wall of right acetabulum, subsequent encounter for fracture with routine healing: Secondary | ICD-10-CM | POA: Diagnosis not present

## 2017-08-28 DIAGNOSIS — S73014D Posterior dislocation of right hip, subsequent encounter: Secondary | ICD-10-CM | POA: Diagnosis not present

## 2017-08-29 ENCOUNTER — Telehealth: Payer: Self-pay

## 2017-08-29 ENCOUNTER — Encounter: Payer: Self-pay | Admitting: Physical Medicine & Rehabilitation

## 2017-08-29 ENCOUNTER — Encounter: Payer: Medicare HMO | Attending: Registered Nurse | Admitting: Physical Medicine & Rehabilitation

## 2017-08-29 VITALS — BP 157/90 | HR 71 | Ht 68.0 in | Wt 188.0 lb

## 2017-08-29 DIAGNOSIS — S32441D Displaced fracture of posterior column [ilioischial] of right acetabulum, subsequent encounter for fracture with routine healing: Secondary | ICD-10-CM

## 2017-08-29 DIAGNOSIS — S32401A Unspecified fracture of right acetabulum, initial encounter for closed fracture: Secondary | ICD-10-CM | POA: Insufficient documentation

## 2017-08-29 DIAGNOSIS — M62838 Other muscle spasm: Secondary | ICD-10-CM | POA: Insufficient documentation

## 2017-08-29 DIAGNOSIS — R269 Unspecified abnormalities of gait and mobility: Secondary | ICD-10-CM

## 2017-08-29 DIAGNOSIS — S2241XA Multiple fractures of ribs, right side, initial encounter for closed fracture: Secondary | ICD-10-CM | POA: Diagnosis not present

## 2017-08-29 DIAGNOSIS — Z8546 Personal history of malignant neoplasm of prostate: Secondary | ICD-10-CM | POA: Diagnosis not present

## 2017-08-29 DIAGNOSIS — S32441S Displaced fracture of posterior column [ilioischial] of right acetabulum, sequela: Secondary | ICD-10-CM | POA: Diagnosis not present

## 2017-08-29 DIAGNOSIS — G479 Sleep disorder, unspecified: Secondary | ICD-10-CM | POA: Insufficient documentation

## 2017-08-29 DIAGNOSIS — R05 Cough: Secondary | ICD-10-CM | POA: Diagnosis not present

## 2017-08-29 DIAGNOSIS — Z9889 Other specified postprocedural states: Secondary | ICD-10-CM | POA: Diagnosis not present

## 2017-08-29 DIAGNOSIS — S2220XA Unspecified fracture of sternum, initial encounter for closed fracture: Secondary | ICD-10-CM | POA: Diagnosis not present

## 2017-08-29 DIAGNOSIS — I10 Essential (primary) hypertension: Secondary | ICD-10-CM
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
## 2017-08-29 NOTE — Telephone Encounter (Signed)
No UDS for pt. He is no longer taking Tramadol and will not continue

## 2017-09-10 DIAGNOSIS — S73014A Posterior dislocation of right hip, initial encounter: Secondary | ICD-10-CM | POA: Diagnosis not present

## 2017-09-23 DIAGNOSIS — S73014D Posterior dislocation of right hip, subsequent encounter: Secondary | ICD-10-CM | POA: Diagnosis not present

## 2017-09-23 DIAGNOSIS — S32421D Displaced fracture of posterior wall of right acetabulum, subsequent encounter for fracture with routine healing: Secondary | ICD-10-CM | POA: Diagnosis not present

## 2017-10-08 DIAGNOSIS — J309 Allergic rhinitis, unspecified: Secondary | ICD-10-CM | POA: Diagnosis not present

## 2017-10-08 DIAGNOSIS — R51 Headache: Secondary | ICD-10-CM | POA: Diagnosis not present

## 2017-10-08 DIAGNOSIS — I10 Essential (primary) hypertension: Secondary | ICD-10-CM | POA: Diagnosis not present

## 2017-10-11 DIAGNOSIS — S73014A Posterior dislocation of right hip, initial encounter: Secondary | ICD-10-CM | POA: Diagnosis not present

## 2017-10-30 DIAGNOSIS — S73014D Posterior dislocation of right hip, subsequent encounter: Secondary | ICD-10-CM | POA: Diagnosis not present

## 2017-10-30 DIAGNOSIS — S32421D Displaced fracture of posterior wall of right acetabulum, subsequent encounter for fracture with routine healing: Secondary | ICD-10-CM | POA: Diagnosis not present

## 2017-11-10 DIAGNOSIS — S73014A Posterior dislocation of right hip, initial encounter: Secondary | ICD-10-CM | POA: Diagnosis not present

## 2017-12-11 DIAGNOSIS — S73014A Posterior dislocation of right hip, initial encounter: Secondary | ICD-10-CM | POA: Diagnosis not present

## 2018-01-10 DIAGNOSIS — S73014A Posterior dislocation of right hip, initial encounter: Secondary | ICD-10-CM | POA: Diagnosis not present

## 2018-01-14 DIAGNOSIS — Z23 Encounter for immunization: Secondary | ICD-10-CM | POA: Diagnosis not present

## 2018-01-14 DIAGNOSIS — E785 Hyperlipidemia, unspecified: Secondary | ICD-10-CM | POA: Diagnosis not present

## 2018-01-14 DIAGNOSIS — I10 Essential (primary) hypertension: Secondary | ICD-10-CM | POA: Diagnosis not present

## 2018-01-17 ENCOUNTER — Inpatient Hospital Stay: Payer: Medicare HMO | Attending: Radiation Oncology

## 2018-01-17 DIAGNOSIS — C61 Malignant neoplasm of prostate: Secondary | ICD-10-CM | POA: Diagnosis not present

## 2018-01-17 LAB — PSA: Prostatic Specific Antigen: 0.22 ng/mL (ref 0.00–4.00)

## 2018-01-24 ENCOUNTER — Ambulatory Visit
Admission: RE | Admit: 2018-01-24 | Discharge: 2018-01-24 | Disposition: A | Discharge status: Home / Self Care | Payer: Medicare HMO | Source: Ambulatory Visit | Attending: Radiation Oncology | Admitting: Radiation Oncology

## 2018-01-24 ENCOUNTER — Encounter: Payer: Self-pay | Admitting: Radiation Oncology

## 2018-01-24 ENCOUNTER — Other Ambulatory Visit: Payer: Self-pay | Admitting: *Deleted

## 2018-01-24 ENCOUNTER — Other Ambulatory Visit: Payer: Self-pay

## 2018-01-24 VITALS — BP 164/98 | HR 80 | Temp 96.0°F | Resp 18 | Wt 196.8 lb

## 2018-01-24 DIAGNOSIS — Z923 Personal history of irradiation: Secondary | ICD-10-CM | POA: Insufficient documentation

## 2018-01-24 DIAGNOSIS — C61 Malignant neoplasm of prostate: Secondary | ICD-10-CM | POA: Diagnosis not present
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
## 2018-02-10 DIAGNOSIS — S73014A Posterior dislocation of right hip, initial encounter: Secondary | ICD-10-CM | POA: Diagnosis not present

## 2018-02-11 DIAGNOSIS — S32421D Displaced fracture of posterior wall of right acetabulum, subsequent encounter for fracture with routine healing: Secondary | ICD-10-CM | POA: Diagnosis not present

## 2018-02-11 DIAGNOSIS — S73014D Posterior dislocation of right hip, subsequent encounter: Secondary | ICD-10-CM | POA: Diagnosis not present

## 2018-03-13 DIAGNOSIS — S73014A Posterior dislocation of right hip, initial encounter: Secondary | ICD-10-CM | POA: Diagnosis not present

## 2018-03-28 ENCOUNTER — Other Ambulatory Visit: Payer: Self-pay | Admitting: Orthopedic Surgery

## 2018-03-28 DIAGNOSIS — M1611 Unilateral primary osteoarthritis, right hip: Secondary | ICD-10-CM

## 2018-04-11 DIAGNOSIS — S73014A Posterior dislocation of right hip, initial encounter: Secondary | ICD-10-CM | POA: Diagnosis not present

## 2018-04-15 DIAGNOSIS — I10 Essential (primary) hypertension: Secondary | ICD-10-CM | POA: Diagnosis not present

## 2018-04-15 DIAGNOSIS — E785 Hyperlipidemia, unspecified: Secondary | ICD-10-CM | POA: Diagnosis not present

## 2018-04-15 DIAGNOSIS — M25551 Pain in right hip: Secondary | ICD-10-CM | POA: Diagnosis not present

## 2018-04-28 ENCOUNTER — Other Ambulatory Visit: Payer: Medicare HMO

## 2018-05-12 DIAGNOSIS — S73014A Posterior dislocation of right hip, initial encounter: Secondary | ICD-10-CM | POA: Diagnosis not present

## 2018-06-11 DIAGNOSIS — S73014A Posterior dislocation of right hip, initial encounter: Secondary | ICD-10-CM | POA: Diagnosis not present

## 2018-06-28 IMAGING — CR DG CHEST 1V PORT
1 series · 1 of 1 positions shown · non-contrast
Comparison: 07/01/2017

CLINICAL DATA: Low oxygen saturation, shortness of breath

EXAM:
PORTABLE CHEST 1 VIEW

[AP]
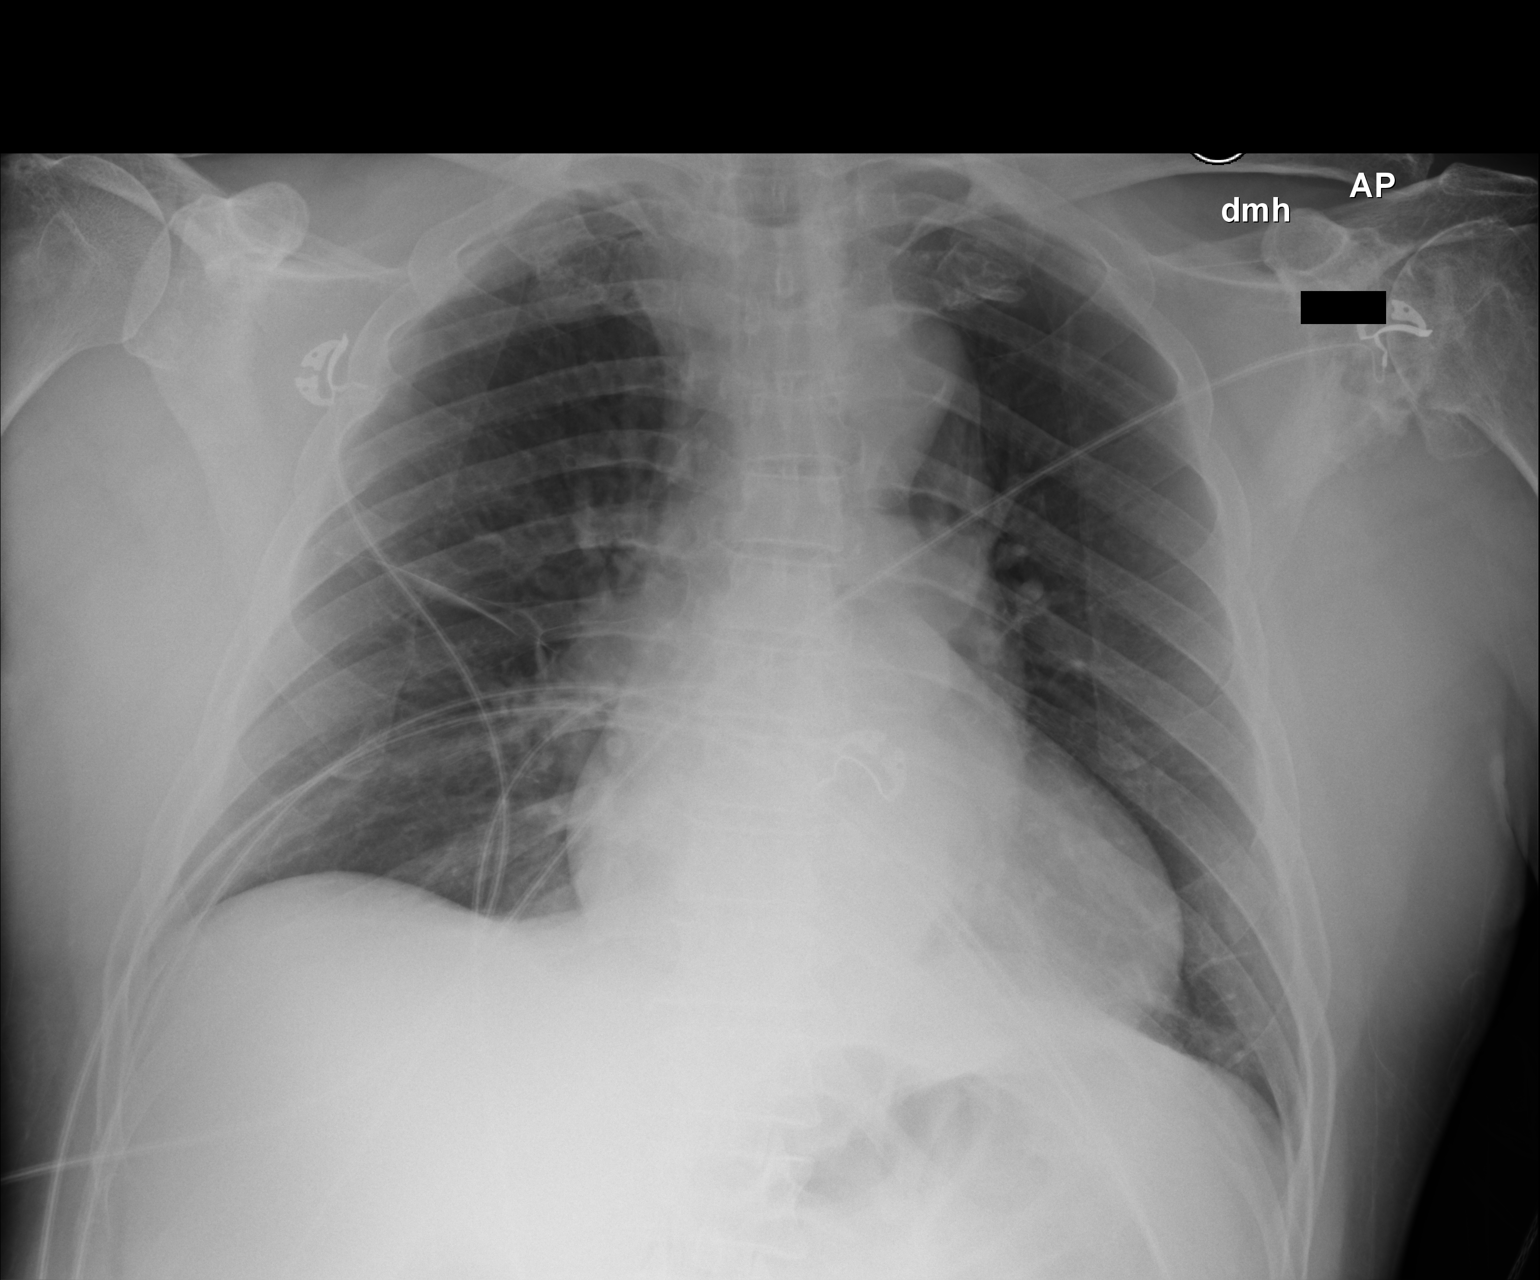

[1 of 1 positions shown; findings below may reference images not displayed]

FINDINGS: Heart is borderline in size. No confluent airspace opacities,
effusions or edema. No acute bony abnormality. Degenerative changes
in the left shoulder.
IMPRESSION: No active disease.

## 2018-07-01 ENCOUNTER — Ambulatory Visit
Admission: RE | Admit: 2018-07-01 | Discharge: 2018-07-01 | Disposition: A | Discharge status: Home / Self Care | Payer: Medicare HMO | Source: Ambulatory Visit | Attending: Orthopedic Surgery | Admitting: Orthopedic Surgery

## 2018-07-01 ENCOUNTER — Other Ambulatory Visit: Payer: Self-pay

## 2018-07-01 DIAGNOSIS — M25551 Pain in right hip: Secondary | ICD-10-CM | POA: Diagnosis not present

## 2018-07-01 DIAGNOSIS — M1611 Unilateral primary osteoarthritis, right hip: Secondary | ICD-10-CM

## 2018-07-01 MED ORDER — METHYLPREDNISOLONE ACETATE 40 MG/ML INJ SUSP (RADIOLOG
120.0000 mg | Freq: Once | INTRAMUSCULAR | Status: AC
  Administered 2018-07-01: 120 mg via INTRA_ARTICULAR

## 2018-07-01 MED ORDER — IOPAMIDOL (ISOVUE-M 200) INJECTION 41%
1.0000 mL | Freq: Once | INTRAMUSCULAR | Status: AC
  Administered 2018-07-01: 1 mL via INTRA_ARTICULAR

## 2018-07-12 DIAGNOSIS — S73014A Posterior dislocation of right hip, initial encounter: Secondary | ICD-10-CM | POA: Diagnosis not present

## 2018-07-16 DIAGNOSIS — Z1331 Encounter for screening for depression: Secondary | ICD-10-CM | POA: Diagnosis not present

## 2018-07-16 DIAGNOSIS — Z6830 Body mass index (BMI) 30.0-30.9, adult: Secondary | ICD-10-CM | POA: Diagnosis not present

## 2018-07-16 DIAGNOSIS — Z1332 Encounter for screening for maternal depression: Secondary | ICD-10-CM | POA: Diagnosis not present

## 2018-07-16 DIAGNOSIS — I10 Essential (primary) hypertension: Secondary | ICD-10-CM | POA: Diagnosis not present

## 2018-07-16 DIAGNOSIS — Z1389 Encounter for screening for other disorder: Secondary | ICD-10-CM | POA: Diagnosis not present

## 2018-07-16 DIAGNOSIS — Z0001 Encounter for general adult medical examination with abnormal findings: Secondary | ICD-10-CM | POA: Diagnosis not present

## 2018-07-16 DIAGNOSIS — E785 Hyperlipidemia, unspecified: Secondary | ICD-10-CM | POA: Diagnosis not present

## 2018-08-13 DIAGNOSIS — S73014D Posterior dislocation of right hip, subsequent encounter: Secondary | ICD-10-CM | POA: Diagnosis not present

## 2018-08-13 DIAGNOSIS — S32421D Displaced fracture of posterior wall of right acetabulum, subsequent encounter for fracture with routine healing: Secondary | ICD-10-CM | POA: Diagnosis not present

## 2018-08-15 DIAGNOSIS — N390 Urinary tract infection, site not specified: Secondary | ICD-10-CM | POA: Diagnosis not present

## 2018-08-15 DIAGNOSIS — I10 Essential (primary) hypertension: Secondary | ICD-10-CM | POA: Diagnosis not present

## 2018-08-15 DIAGNOSIS — E785 Hyperlipidemia, unspecified: Secondary | ICD-10-CM | POA: Diagnosis not present

## 2018-09-15 DIAGNOSIS — E785 Hyperlipidemia, unspecified: Secondary | ICD-10-CM | POA: Diagnosis not present

## 2018-09-15 DIAGNOSIS — I10 Essential (primary) hypertension: Secondary | ICD-10-CM | POA: Diagnosis not present

## 2018-10-16 DIAGNOSIS — E785 Hyperlipidemia, unspecified: Secondary | ICD-10-CM | POA: Diagnosis not present

## 2018-10-16 DIAGNOSIS — I10 Essential (primary) hypertension: Secondary | ICD-10-CM | POA: Diagnosis not present

## 2018-11-15 DIAGNOSIS — I10 Essential (primary) hypertension: Secondary | ICD-10-CM | POA: Diagnosis not present

## 2018-11-15 DIAGNOSIS — E785 Hyperlipidemia, unspecified: Secondary | ICD-10-CM | POA: Diagnosis not present

## 2018-12-10 DIAGNOSIS — E785 Hyperlipidemia, unspecified: Secondary | ICD-10-CM | POA: Diagnosis not present

## 2018-12-10 DIAGNOSIS — I10 Essential (primary) hypertension: Secondary | ICD-10-CM | POA: Diagnosis not present

## 2018-12-10 DIAGNOSIS — Z23 Encounter for immunization: Secondary | ICD-10-CM | POA: Diagnosis not present

## 2018-12-10 DIAGNOSIS — Z8546 Personal history of malignant neoplasm of prostate: Secondary | ICD-10-CM | POA: Diagnosis not present

## 2019-01-09 DIAGNOSIS — E785 Hyperlipidemia, unspecified: Secondary | ICD-10-CM | POA: Diagnosis not present

## 2019-01-09 DIAGNOSIS — I10 Essential (primary) hypertension: Secondary | ICD-10-CM | POA: Diagnosis not present

## 2019-01-19 ENCOUNTER — Inpatient Hospital Stay: Payer: Medicare HMO | Attending: Radiation Oncology

## 2019-01-19 ENCOUNTER — Other Ambulatory Visit: Payer: Self-pay

## 2019-01-19 DIAGNOSIS — C61 Malignant neoplasm of prostate: Secondary | ICD-10-CM | POA: Insufficient documentation

## 2019-01-19 LAB — PSA: Prostatic Specific Antigen: 0.22 ng/mL (ref 0.00–4.00)

## 2019-01-26 ENCOUNTER — Ambulatory Visit
Admission: RE | Admit: 2019-01-26 | Discharge: 2019-01-26 | Disposition: A | Discharge status: Home / Self Care | Payer: Medicare HMO | Source: Ambulatory Visit | Attending: Radiation Oncology | Admitting: Radiation Oncology

## 2019-01-26 ENCOUNTER — Encounter: Payer: Self-pay | Admitting: Radiation Oncology

## 2019-01-26 ENCOUNTER — Other Ambulatory Visit: Payer: Self-pay

## 2019-01-26 VITALS — BP 156/92 | HR 79 | Temp 97.6°F | Resp 18 | Wt 206.6 lb

## 2019-01-26 DIAGNOSIS — Z923 Personal history of irradiation: Secondary | ICD-10-CM | POA: Diagnosis not present

## 2019-01-26 DIAGNOSIS — C61 Malignant neoplasm of prostate: Secondary | ICD-10-CM | POA: Insufficient documentation

## 2019-01-26 NOTE — Progress Notes (Signed)
Radiation Oncology Follow up Note  Name: Darrell Harrison.   Date:   01/26/2019 MRN:  [id number] DOB: [birth year]    This 73 y.o. male presents to the clinic today for 4-year follow-up status post IMRT radiation therapy for stage IIb adenocarcinoma the prostate.  REFERRING PROVIDER: Rosita Fire, MD  HPI: Patient is a 73 year old male now at over 4 years having completed IMRT radiation therapy for Gleason 6 (3+3) adenocarcinoma the prostate presenting with a PSA of 23.  Seen today in routine follow-up he is doing well.  His PSA remains stable at 0.2.  He specifically denies any increased lower urinary tract symptoms diarrhea or fatigue..  COMPLICATIONS OF TREATMENT: none  FOLLOW UP COMPLIANCE: keeps appointments   PHYSICAL EXAM:  BP (!) 156/92 (BP Location: Left Arm, Patient Position: Sitting)   Pulse 79   Temp 97.6 F (36.4 C) (Tympanic)   Resp 18   Wt 206 lb 9.6 oz (93.7 kg)   BMI 31.41 kg/m  Well-developed well-nourished patient in NAD. HEENT reveals PERLA, EOMI, discs not visualized.  Oral cavity is clear. No oral mucosal lesions are identified. Neck is clear without evidence of cervical or supraclavicular adenopathy. Lungs are clear to A&P. Cardiac examination is essentially unremarkable with regular rate and rhythm without murmur rub or thrill. Abdomen is benign with no organomegaly or masses noted. Motor sensory and DTR levels are equal and symmetric in the upper and lower extremities. Cranial nerves II through XII are grossly intact. Proprioception is intact. No peripheral adenopathy or edema is identified. No motor or sensory levels are noted. Crude visual fields are within normal range.  RADIOLOGY RESULTS: No current films to review  PLAN: At the present time from a urologic standpoint he is doing well with excellent biochemical control of his prostate cancer.  I am pleased with his overall progress.  Since we are close to 5 years out I am going to turn follow-up care and  yearly PSAs over to his PMD.  Patient is to call at anytime with any concerns.  I would like to take this opportunity to thank you for allowing me to participate in the care of your patient.Noreene Filbert, MD

## 2019-02-09 DIAGNOSIS — E785 Hyperlipidemia, unspecified: Secondary | ICD-10-CM | POA: Diagnosis not present

## 2019-02-09 DIAGNOSIS — I10 Essential (primary) hypertension: Secondary | ICD-10-CM | POA: Diagnosis not present

## 2019-03-12 DIAGNOSIS — I10 Essential (primary) hypertension: Secondary | ICD-10-CM | POA: Diagnosis not present

## 2019-03-12 DIAGNOSIS — E785 Hyperlipidemia, unspecified: Secondary | ICD-10-CM | POA: Diagnosis not present

## 2019-04-09 DIAGNOSIS — E785 Hyperlipidemia, unspecified: Secondary | ICD-10-CM | POA: Diagnosis not present

## 2019-04-09 DIAGNOSIS — I10 Essential (primary) hypertension: Secondary | ICD-10-CM | POA: Diagnosis not present

## 2019-05-10 DIAGNOSIS — E785 Hyperlipidemia, unspecified: Secondary | ICD-10-CM | POA: Diagnosis not present

## 2019-05-10 DIAGNOSIS — I10 Essential (primary) hypertension: Secondary | ICD-10-CM | POA: Diagnosis not present

## 2019-06-09 DIAGNOSIS — I10 Essential (primary) hypertension: Secondary | ICD-10-CM | POA: Diagnosis not present

## 2019-06-09 DIAGNOSIS — E785 Hyperlipidemia, unspecified: Secondary | ICD-10-CM | POA: Diagnosis not present

## 2019-06-11 ENCOUNTER — Ambulatory Visit: Payer: Medicare HMO | Attending: Internal Medicine

## 2019-06-11 DIAGNOSIS — Z23 Encounter for immunization: Secondary | ICD-10-CM

## 2019-06-11 NOTE — Progress Notes (Signed)
   [id number]  Name:  Laurier Cambron.    MRN: [id number] DOB: July 26, 1945  06/11/2019  Mr. Trautman was observed post Covid-19 immunization for 15 minutes without incident. He was provided with Vaccine Information Sheet and instruction to access the V-Safe system.   Mr. Laplume was instructed to call 911 with any severe reactions post vaccine: Marland Kitchen Difficulty breathing  . Swelling of face and throat  . A fast heartbeat  . A bad rash all over body  . Dizziness and weakness   Immunizations Administered    Name Date Dose VIS Date Route   Moderna COVID-19 Vaccine 06/11/2019  9:16 AM 0.5 mL 12/2018 Intramuscular   Manufacturer: Moderna   Lot: [id number]   [id number]
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
## 2019-07-09 ENCOUNTER — Ambulatory Visit: Payer: Medicare Other | Attending: Internal Medicine

## 2019-07-09 DIAGNOSIS — Z23 Encounter for immunization: Secondary | ICD-10-CM

## 2019-07-09 NOTE — Progress Notes (Signed)
   [id number]  Name:  Sani Madariaga.    MRN: [id number] DOB: April 23, 1945  07/09/2019  Mr. Whiting was observed post Covid-19 immunization for 15 minutes without incident. He was provided with Vaccine Information Sheet and instruction to access the V-Safe system.   Mr. Cookson was instructed to call 911 with any severe reactions post vaccine: Marland Kitchen Difficulty breathing  . Swelling of face and throat  . A fast heartbeat  . A bad rash all over body  . Dizziness and weakness   Immunizations Administered    Name Date Dose VIS Date Route   Moderna COVID-19 Vaccine 07/09/2019  8:58 AM 0.5 mL 12/2018 Intramuscular   Manufacturer: Moderna   Lot: [id number]   [place]: [id number]

## 2019-07-13 DIAGNOSIS — R739 Hyperglycemia, unspecified: Secondary | ICD-10-CM | POA: Diagnosis not present

## 2019-07-13 DIAGNOSIS — Z0001 Encounter for general adult medical examination with abnormal findings: Secondary | ICD-10-CM | POA: Diagnosis not present

## 2019-07-13 DIAGNOSIS — Z1389 Encounter for screening for other disorder: Secondary | ICD-10-CM | POA: Diagnosis not present

## 2019-07-13 DIAGNOSIS — I10 Essential (primary) hypertension: Secondary | ICD-10-CM | POA: Diagnosis not present

## 2019-07-13 DIAGNOSIS — E785 Hyperlipidemia, unspecified: Secondary | ICD-10-CM | POA: Diagnosis not present

## 2019-07-13 DIAGNOSIS — Z79899 Other long term (current) drug therapy: Secondary | ICD-10-CM | POA: Diagnosis not present

## 2019-08-12 DIAGNOSIS — I10 Essential (primary) hypertension: Secondary | ICD-10-CM | POA: Diagnosis not present

## 2019-08-12 DIAGNOSIS — E785 Hyperlipidemia, unspecified: Secondary | ICD-10-CM | POA: Diagnosis not present

## 2019-09-12 DIAGNOSIS — I10 Essential (primary) hypertension: Secondary | ICD-10-CM | POA: Diagnosis not present

## 2019-10-15 DIAGNOSIS — I10 Essential (primary) hypertension: Secondary | ICD-10-CM | POA: Diagnosis not present

## 2019-10-15 DIAGNOSIS — E785 Hyperlipidemia, unspecified: Secondary | ICD-10-CM | POA: Diagnosis not present

## 2021-09-05 DIAGNOSIS — M1990 Unspecified osteoarthritis, unspecified site: Secondary | ICD-10-CM | POA: Diagnosis not present

## 2021-09-05 DIAGNOSIS — I10 Essential (primary) hypertension: Secondary | ICD-10-CM | POA: Diagnosis not present

## 2021-10-06 DIAGNOSIS — G4700 Insomnia, unspecified: Secondary | ICD-10-CM | POA: Diagnosis not present

## 2021-10-06 DIAGNOSIS — I10 Essential (primary) hypertension: Secondary | ICD-10-CM | POA: Diagnosis not present

## 2021-11-05 DIAGNOSIS — G4700 Insomnia, unspecified: Secondary | ICD-10-CM | POA: Diagnosis not present

## 2021-11-05 DIAGNOSIS — I10 Essential (primary) hypertension: Secondary | ICD-10-CM | POA: Diagnosis not present

## 2021-12-06 DIAGNOSIS — E785 Hyperlipidemia, unspecified: Secondary | ICD-10-CM | POA: Diagnosis not present

## 2021-12-06 DIAGNOSIS — I10 Essential (primary) hypertension: Secondary | ICD-10-CM | POA: Diagnosis not present

## 2022-01-04 DIAGNOSIS — G4700 Insomnia, unspecified: Secondary | ICD-10-CM | POA: Diagnosis not present

## 2022-01-04 DIAGNOSIS — Z23 Encounter for immunization: Secondary | ICD-10-CM | POA: Diagnosis not present

## 2022-01-04 DIAGNOSIS — E785 Hyperlipidemia, unspecified: Secondary | ICD-10-CM | POA: Diagnosis not present

## 2022-01-04 DIAGNOSIS — I10 Essential (primary) hypertension: Secondary | ICD-10-CM | POA: Diagnosis not present

## 2022-02-04 DIAGNOSIS — M1990 Unspecified osteoarthritis, unspecified site: Secondary | ICD-10-CM | POA: Diagnosis not present

## 2022-02-04 DIAGNOSIS — I10 Essential (primary) hypertension: Secondary | ICD-10-CM | POA: Diagnosis not present

## 2022-03-07 DIAGNOSIS — I10 Essential (primary) hypertension: Secondary | ICD-10-CM | POA: Diagnosis not present

## 2022-03-07 DIAGNOSIS — M1990 Unspecified osteoarthritis, unspecified site: Secondary | ICD-10-CM | POA: Diagnosis not present

## 2022-04-05 DIAGNOSIS — I10 Essential (primary) hypertension: Secondary | ICD-10-CM | POA: Diagnosis not present

## 2022-04-05 DIAGNOSIS — M1990 Unspecified osteoarthritis, unspecified site: Secondary | ICD-10-CM | POA: Diagnosis not present

## 2022-05-06 DIAGNOSIS — M1990 Unspecified osteoarthritis, unspecified site: Secondary | ICD-10-CM | POA: Diagnosis not present

## 2022-05-06 DIAGNOSIS — I10 Essential (primary) hypertension: Secondary | ICD-10-CM | POA: Diagnosis not present

## 2022-06-05 DIAGNOSIS — I10 Essential (primary) hypertension: Secondary | ICD-10-CM | POA: Diagnosis not present

## 2022-06-05 DIAGNOSIS — M1990 Unspecified osteoarthritis, unspecified site: Secondary | ICD-10-CM | POA: Diagnosis not present

## 2022-07-06 DIAGNOSIS — G4700 Insomnia, unspecified: Secondary | ICD-10-CM | POA: Diagnosis not present

## 2022-07-06 DIAGNOSIS — Z0001 Encounter for general adult medical examination with abnormal findings: Secondary | ICD-10-CM | POA: Diagnosis not present

## 2022-07-06 DIAGNOSIS — I10 Essential (primary) hypertension: Secondary | ICD-10-CM | POA: Diagnosis not present

## 2022-07-06 DIAGNOSIS — Z1389 Encounter for screening for other disorder: Secondary | ICD-10-CM | POA: Diagnosis not present

## 2022-07-06 DIAGNOSIS — M1990 Unspecified osteoarthritis, unspecified site: Secondary | ICD-10-CM | POA: Diagnosis not present

## 2022-07-06 DIAGNOSIS — E785 Hyperlipidemia, unspecified: Secondary | ICD-10-CM | POA: Diagnosis not present

## 2022-08-05 DIAGNOSIS — I10 Essential (primary) hypertension: Secondary | ICD-10-CM | POA: Diagnosis not present

## 2022-08-05 DIAGNOSIS — M1990 Unspecified osteoarthritis, unspecified site: Secondary | ICD-10-CM | POA: Diagnosis not present

## 2022-09-05 DIAGNOSIS — M1990 Unspecified osteoarthritis, unspecified site: Secondary | ICD-10-CM | POA: Diagnosis not present

## 2022-09-05 DIAGNOSIS — I10 Essential (primary) hypertension: Secondary | ICD-10-CM | POA: Diagnosis not present

## 2022-10-08 DIAGNOSIS — I10 Essential (primary) hypertension: Secondary | ICD-10-CM | POA: Diagnosis not present

## 2022-10-08 DIAGNOSIS — M1990 Unspecified osteoarthritis, unspecified site: Secondary | ICD-10-CM | POA: Diagnosis not present

## 2022-11-07 DIAGNOSIS — I10 Essential (primary) hypertension: Secondary | ICD-10-CM | POA: Diagnosis not present

## 2022-11-07 DIAGNOSIS — M1990 Unspecified osteoarthritis, unspecified site: Secondary | ICD-10-CM | POA: Diagnosis not present

## 2022-12-08 DIAGNOSIS — M1990 Unspecified osteoarthritis, unspecified site: Secondary | ICD-10-CM | POA: Diagnosis not present

## 2022-12-08 DIAGNOSIS — I10 Essential (primary) hypertension: Secondary | ICD-10-CM | POA: Diagnosis not present

## 2022-12-31 DIAGNOSIS — E785 Hyperlipidemia, unspecified: Secondary | ICD-10-CM | POA: Diagnosis not present

## 2022-12-31 DIAGNOSIS — G4700 Insomnia, unspecified: Secondary | ICD-10-CM | POA: Diagnosis not present

## 2022-12-31 DIAGNOSIS — I10 Essential (primary) hypertension: Secondary | ICD-10-CM | POA: Diagnosis not present

## 2022-12-31 DIAGNOSIS — Z23 Encounter for immunization: Secondary | ICD-10-CM | POA: Diagnosis not present

## 2023-01-31 DIAGNOSIS — M1990 Unspecified osteoarthritis, unspecified site: Secondary | ICD-10-CM | POA: Diagnosis not present

## 2023-01-31 DIAGNOSIS — I10 Essential (primary) hypertension: Secondary | ICD-10-CM | POA: Diagnosis not present

## 2023-03-04 DIAGNOSIS — I10 Essential (primary) hypertension: Secondary | ICD-10-CM | POA: Diagnosis not present

## 2023-03-04 DIAGNOSIS — M1990 Unspecified osteoarthritis, unspecified site: Secondary | ICD-10-CM | POA: Diagnosis not present

## 2023-04-01 DIAGNOSIS — M1990 Unspecified osteoarthritis, unspecified site: Secondary | ICD-10-CM | POA: Diagnosis not present

## 2023-04-01 DIAGNOSIS — I10 Essential (primary) hypertension: Secondary | ICD-10-CM | POA: Diagnosis not present

## 2023-05-01 DIAGNOSIS — M1990 Unspecified osteoarthritis, unspecified site: Secondary | ICD-10-CM | POA: Diagnosis not present

## 2023-05-01 DIAGNOSIS — I10 Essential (primary) hypertension: Secondary | ICD-10-CM | POA: Diagnosis not present

## 2023-05-31 DIAGNOSIS — M1990 Unspecified osteoarthritis, unspecified site: Secondary | ICD-10-CM | POA: Diagnosis not present

## 2023-05-31 DIAGNOSIS — I10 Essential (primary) hypertension: Secondary | ICD-10-CM | POA: Diagnosis not present

## 2023-06-12 ENCOUNTER — Encounter: Payer: Self-pay | Admitting: *Deleted

## 2023-07-08 DIAGNOSIS — M1990 Unspecified osteoarthritis, unspecified site: Secondary | ICD-10-CM | POA: Diagnosis not present

## 2023-07-08 DIAGNOSIS — G4700 Insomnia, unspecified: Secondary | ICD-10-CM | POA: Diagnosis not present

## 2023-07-08 DIAGNOSIS — Z1389 Encounter for screening for other disorder: Secondary | ICD-10-CM | POA: Diagnosis not present

## 2023-07-08 DIAGNOSIS — Z0001 Encounter for general adult medical examination with abnormal findings: Secondary | ICD-10-CM | POA: Diagnosis not present

## 2023-07-08 DIAGNOSIS — E785 Hyperlipidemia, unspecified: Secondary | ICD-10-CM | POA: Diagnosis not present

## 2023-07-08 DIAGNOSIS — I10 Essential (primary) hypertension: Secondary | ICD-10-CM | POA: Diagnosis not present

## 2023-07-08 DIAGNOSIS — J3089 Other allergic rhinitis: Secondary | ICD-10-CM | POA: Diagnosis not present

## 2023-07-09 DIAGNOSIS — M1990 Unspecified osteoarthritis, unspecified site: Secondary | ICD-10-CM | POA: Diagnosis not present

## 2023-07-09 DIAGNOSIS — Z0001 Encounter for general adult medical examination with abnormal findings: Secondary | ICD-10-CM | POA: Diagnosis not present

## 2023-07-09 DIAGNOSIS — I10 Essential (primary) hypertension: Secondary | ICD-10-CM | POA: Diagnosis not present

## 2023-07-09 DIAGNOSIS — J3089 Other allergic rhinitis: Secondary | ICD-10-CM | POA: Diagnosis not present

## 2023-07-09 DIAGNOSIS — Z1389 Encounter for screening for other disorder: Secondary | ICD-10-CM | POA: Diagnosis not present

## 2023-08-07 DIAGNOSIS — M1990 Unspecified osteoarthritis, unspecified site: Secondary | ICD-10-CM | POA: Diagnosis not present

## 2023-08-07 DIAGNOSIS — I10 Essential (primary) hypertension: Secondary | ICD-10-CM | POA: Diagnosis not present

## 2023-09-07 DIAGNOSIS — I10 Essential (primary) hypertension: Secondary | ICD-10-CM | POA: Diagnosis not present

## 2023-09-07 DIAGNOSIS — M1990 Unspecified osteoarthritis, unspecified site: Secondary | ICD-10-CM | POA: Diagnosis not present

## 2023-10-08 DIAGNOSIS — I10 Essential (primary) hypertension: Secondary | ICD-10-CM | POA: Diagnosis not present

## 2023-10-08 DIAGNOSIS — M1990 Unspecified osteoarthritis, unspecified site: Secondary | ICD-10-CM | POA: Diagnosis not present

## 2023-11-07 DIAGNOSIS — I10 Essential (primary) hypertension: Secondary | ICD-10-CM | POA: Diagnosis not present

## 2023-11-07 DIAGNOSIS — M1990 Unspecified osteoarthritis, unspecified site: Secondary | ICD-10-CM | POA: Diagnosis not present
